# Patient Record
Sex: Female | Born: 1960 | Race: White | Hispanic: No | Marital: Married | Smoking: Never smoker
Health system: Southern US, Community
[De-identification: ages and names within clinical notes are randomized; demographics above are authoritative.]

## PROBLEM LIST (undated history)

## (undated) DIAGNOSIS — I10 Essential (primary) hypertension: Secondary | ICD-10-CM

## (undated) DIAGNOSIS — N200 Calculus of kidney: Secondary | ICD-10-CM

## (undated) DIAGNOSIS — Z85828 Personal history of other malignant neoplasm of skin: Secondary | ICD-10-CM

## (undated) DIAGNOSIS — K219 Gastro-esophageal reflux disease without esophagitis: Secondary | ICD-10-CM

## (undated) DIAGNOSIS — T7840XA Allergy, unspecified, initial encounter: Secondary | ICD-10-CM

## (undated) DIAGNOSIS — R42 Dizziness and giddiness: Secondary | ICD-10-CM

## (undated) HISTORY — PX: BREAST CYST EXCISION: SHX579

## (undated) HISTORY — DX: Personal history of other malignant neoplasm of skin: Z85.828

## (undated) HISTORY — DX: Allergy, unspecified, initial encounter: T78.40XA

## (undated) HISTORY — DX: Essential (primary) hypertension: I10

## (undated) HISTORY — DX: Dizziness and giddiness: R42

## (undated) HISTORY — PX: CERVICAL DISC SURGERY: SHX588

## (undated) HISTORY — DX: Gastro-esophageal reflux disease without esophagitis: K21.9

## (undated) HISTORY — DX: Calculus of kidney: N20.0

---

## 1996-06-06 HISTORY — PX: DILATION AND CURETTAGE OF UTERUS: SHX78

## 1998-11-17 ENCOUNTER — Other Ambulatory Visit: Admission: RE | Admit: 1998-11-17 | Discharge: 1998-11-17 | Payer: Self-pay | Admitting: Obstetrics and Gynecology

## 1999-06-14 ENCOUNTER — Other Ambulatory Visit: Admission: RE | Admit: 1999-06-14 | Discharge: 1999-06-14 | Payer: Self-pay | Admitting: *Deleted

## 1999-11-15 ENCOUNTER — Other Ambulatory Visit: Admission: RE | Admit: 1999-11-15 | Discharge: 1999-11-15 | Payer: Self-pay | Admitting: *Deleted

## 1999-12-07 ENCOUNTER — Ambulatory Visit (HOSPITAL_COMMUNITY): Admission: RE | Admit: 1999-12-07 | Discharge: 1999-12-07 | Payer: Self-pay | Admitting: Obstetrics and Gynecology

## 1999-12-07 ENCOUNTER — Encounter: Payer: Self-pay | Admitting: Obstetrics and Gynecology

## 2000-06-22 ENCOUNTER — Other Ambulatory Visit: Admission: RE | Admit: 2000-06-22 | Discharge: 2000-06-22 | Payer: Self-pay | Admitting: *Deleted

## 2001-01-11 ENCOUNTER — Ambulatory Visit (HOSPITAL_COMMUNITY): Admission: RE | Admit: 2001-01-11 | Discharge: 2001-01-11 | Payer: Self-pay | Admitting: Obstetrics and Gynecology

## 2001-01-11 ENCOUNTER — Encounter: Payer: Self-pay | Admitting: Obstetrics and Gynecology

## 2001-08-09 ENCOUNTER — Other Ambulatory Visit: Admission: RE | Admit: 2001-08-09 | Discharge: 2001-08-09 | Payer: Self-pay | Admitting: *Deleted

## 2002-03-05 ENCOUNTER — Ambulatory Visit (HOSPITAL_COMMUNITY): Admission: RE | Admit: 2002-03-05 | Discharge: 2002-03-05 | Payer: Self-pay | Admitting: *Deleted

## 2003-07-17 ENCOUNTER — Ambulatory Visit (HOSPITAL_COMMUNITY): Admission: RE | Admit: 2003-07-17 | Discharge: 2003-07-17 | Payer: Self-pay | Admitting: *Deleted

## 2003-07-22 ENCOUNTER — Encounter: Admission: RE | Admit: 2003-07-22 | Discharge: 2003-07-22 | Payer: Self-pay | Admitting: Emergency Medicine

## 2003-09-01 ENCOUNTER — Other Ambulatory Visit: Admission: RE | Admit: 2003-09-01 | Discharge: 2003-09-01 | Payer: Self-pay | Admitting: *Deleted

## 2004-08-04 ENCOUNTER — Encounter: Admission: RE | Admit: 2004-08-04 | Discharge: 2004-08-04 | Payer: Self-pay | Admitting: *Deleted

## 2005-09-20 ENCOUNTER — Encounter: Admission: RE | Admit: 2005-09-20 | Discharge: 2005-09-20 | Payer: Self-pay | Admitting: *Deleted

## 2005-09-26 ENCOUNTER — Ambulatory Visit: Payer: Self-pay | Admitting: Internal Medicine

## 2005-10-03 ENCOUNTER — Ambulatory Visit: Payer: Self-pay | Admitting: Internal Medicine

## 2006-05-08 ENCOUNTER — Ambulatory Visit: Payer: Self-pay | Admitting: Internal Medicine

## 2006-05-10 ENCOUNTER — Encounter: Admission: RE | Admit: 2006-05-10 | Discharge: 2006-05-10 | Payer: Self-pay | Admitting: Internal Medicine

## 2006-05-17 ENCOUNTER — Encounter: Admission: RE | Admit: 2006-05-17 | Discharge: 2006-06-01 | Payer: Self-pay | Admitting: Internal Medicine

## 2006-05-17 ENCOUNTER — Encounter: Payer: Self-pay | Admitting: Internal Medicine

## 2006-10-18 ENCOUNTER — Encounter: Admission: RE | Admit: 2006-10-18 | Discharge: 2006-10-18 | Payer: Self-pay | Admitting: Obstetrics and Gynecology

## 2007-01-02 ENCOUNTER — Ambulatory Visit: Payer: Self-pay | Admitting: Internal Medicine

## 2007-01-05 DIAGNOSIS — Z85820 Personal history of malignant melanoma of skin: Secondary | ICD-10-CM

## 2007-01-05 DIAGNOSIS — Z85828 Personal history of other malignant neoplasm of skin: Secondary | ICD-10-CM

## 2007-01-05 HISTORY — DX: Personal history of other malignant neoplasm of skin: Z85.828

## 2007-04-18 ENCOUNTER — Ambulatory Visit: Payer: Self-pay | Admitting: Internal Medicine

## 2007-11-09 ENCOUNTER — Encounter: Admission: RE | Admit: 2007-11-09 | Discharge: 2007-11-09 | Payer: Self-pay | Admitting: Obstetrics and Gynecology

## 2007-12-13 ENCOUNTER — Telehealth: Payer: Self-pay | Admitting: Internal Medicine

## 2007-12-14 ENCOUNTER — Ambulatory Visit: Payer: Self-pay | Admitting: Internal Medicine

## 2008-08-18 ENCOUNTER — Ambulatory Visit: Payer: Self-pay | Admitting: Internal Medicine

## 2008-08-18 LAB — CONVERTED CEMR LAB
ALT: 35 units/L (ref 0–35)
AST: 28 units/L (ref 0–37)
Basophils Absolute: 0 10*3/uL (ref 0.0–0.1)
Basophils Relative: 0.5 % (ref 0.0–3.0)
CO2: 30 meq/L (ref 19–32)
Calcium: 9 mg/dL (ref 8.4–10.5)
GFR calc Af Amer: 169 mL/min
MCV: 97.4 fL (ref 78.0–100.0)
Monocytes Absolute: 0.5 10*3/uL (ref 0.1–1.0)
Neutrophils Relative %: 64.8 % (ref 43.0–77.0)
Nitrite: NEGATIVE
Potassium: 3.5 meq/L (ref 3.5–5.1)
Protein, U semiquant: NEGATIVE
TSH: 0.75 microintl units/mL (ref 0.35–5.50)
Total Bilirubin: 0.8 mg/dL (ref 0.3–1.2)
Total CHOL/HDL Ratio: 3.4
Total Protein: 7 g/dL (ref 6.0–8.3)
Triglycerides: 120 mg/dL (ref 0–149)
Urobilinogen, UA: 0.2
WBC: 6.8 10*3/uL (ref 4.5–10.5)

## 2008-08-27 ENCOUNTER — Ambulatory Visit: Payer: Self-pay | Admitting: Internal Medicine

## 2008-11-17 ENCOUNTER — Encounter: Admission: RE | Admit: 2008-11-17 | Discharge: 2008-11-17 | Payer: Self-pay | Admitting: Internal Medicine

## 2009-02-06 ENCOUNTER — Ambulatory Visit: Payer: Self-pay | Admitting: Internal Medicine

## 2009-08-03 ENCOUNTER — Ambulatory Visit: Payer: Self-pay | Admitting: Internal Medicine

## 2009-10-16 ENCOUNTER — Ambulatory Visit: Payer: Self-pay | Admitting: Family Medicine

## 2009-10-16 LAB — CONVERTED CEMR LAB
Nitrite: NEGATIVE
Urobilinogen, UA: 0.2

## 2009-12-17 ENCOUNTER — Encounter: Admission: RE | Admit: 2009-12-17 | Discharge: 2009-12-17 | Payer: Self-pay | Admitting: Internal Medicine

## 2009-12-17 LAB — HM MAMMOGRAPHY: HM Mammogram: NEGATIVE

## 2010-03-24 ENCOUNTER — Ambulatory Visit: Payer: Self-pay | Admitting: Internal Medicine

## 2010-03-24 LAB — CONVERTED CEMR LAB
ALT: 18 units/L (ref 0–35)
AST: 17 units/L (ref 0–37)
Alkaline Phosphatase: 70 units/L (ref 39–117)
Basophils Absolute: 0 10*3/uL (ref 0.0–0.1)
Basophils Relative: 0.4 % (ref 0.0–3.0)
Bilirubin, Direct: 0.1 mg/dL (ref 0.0–0.3)
CO2: 30 meq/L (ref 19–32)
Calcium: 9.3 mg/dL (ref 8.4–10.5)
Chloride: 104 meq/L (ref 96–112)
Direct LDL: 118.5 mg/dL
GFR calc non Af Amer: 112.09 mL/min (ref >60)
Lymphocytes Relative: 28.1 % (ref 12.0–46.0)
Neutro Abs: 3.8 10*3/uL (ref 1.4–7.7)
Neutrophils Relative %: 60.3 % (ref 43.0–77.0)
Nitrite: POSITIVE
Platelets: 205 10*3/uL (ref 150.0–400.0)
Specific Gravity, Urine: 1.02
Total Protein: 6.5 g/dL (ref 6.0–8.3)
Triglycerides: 89 mg/dL (ref 0.0–149.0)
Urobilinogen, UA: 0.2

## 2010-04-01 ENCOUNTER — Encounter: Payer: Self-pay | Admitting: Internal Medicine

## 2010-04-01 ENCOUNTER — Ambulatory Visit: Payer: Self-pay | Admitting: Internal Medicine

## 2010-04-01 DIAGNOSIS — R42 Dizziness and giddiness: Secondary | ICD-10-CM

## 2010-04-01 HISTORY — DX: Dizziness and giddiness: R42

## 2010-04-08 ENCOUNTER — Encounter: Admission: RE | Admit: 2010-04-08 | Discharge: 2010-04-27 | Payer: Self-pay | Admitting: Internal Medicine

## 2010-04-20 ENCOUNTER — Encounter: Payer: Self-pay | Admitting: Internal Medicine

## 2010-04-22 ENCOUNTER — Encounter: Payer: Self-pay | Admitting: Internal Medicine

## 2010-05-03 ENCOUNTER — Telehealth: Payer: Self-pay | Admitting: Internal Medicine

## 2010-05-07 ENCOUNTER — Encounter
Admission: RE | Admit: 2010-05-07 | Discharge: 2010-06-03 | Payer: Self-pay | Source: Home / Self Care | Attending: Internal Medicine | Admitting: Internal Medicine

## 2010-05-24 ENCOUNTER — Encounter: Payer: Self-pay | Admitting: Internal Medicine

## 2010-06-08 ENCOUNTER — Encounter (INDEPENDENT_AMBULATORY_CARE_PROVIDER_SITE_OTHER): Payer: Self-pay | Admitting: *Deleted

## 2010-07-06 NOTE — Miscellaneous (Signed)
Summary: Discharge Summary for PT [facility]  Discharge Summary for PT Services/[facility]   Imported By: Maryln Gottron 05/11/2010 12:16:30  _____________________________________________________________________  External Attachment:    Type:   Image     Comment:   External Document

## 2010-07-06 NOTE — Assessment & Plan Note (Signed)
Summary: CPX   Vital Signs:  Patient profile:   50 year old female Height:      65 inches Weight:      132 pounds BMI:     22.05 Temp:     98.5 degrees F oral Pulse rate:   65 / minute Pulse rhythm:   regular Resp:     12 per minute BP sitting:   142 / 78  Vitals Entered By: Lynann Beaver CMA AAMA (April 01, 2010 8:18 AM) CC: cpx Is Patient Diabetic? No Pain Assessment Patient in pain? no        CC:  cpx.  History of Present Illness: CPX  Intermittent vertigo---ongoing for years.   Current Medications (verified): 1)  Vitamin D3 1000 Unit Tabs (Cholecalciferol) .... Twice Daily 2)  Zantac 150 Mg  Tabs (Ranitidine Hcl) .... Prn 3)  Zyrtec Allergy 10 Mg  Tabs (Cetirizine Hcl) .... Prn  Allergies (verified): No Known Drug Allergies  Past History:  Past Medical History: Reviewed history from 08/03/2009 and no changes required. Skin cancer, hx of,  BCC L shoulder, melanoma L leg neck orthopedic trouble Allergic rhinitis  Past Surgical History: local excision leg melanoma  cervical disc surgeries for neck orthopedic problem  Family History: father-- DM  mother--living , breast CA Family History Diabetes 1st degree relative--parents   Impression & Recommendations:  Problem # 1:  PREVENTIVE HEALTH CARE (ICD-V70.0)  Orders: Gastroenterology Referral (GI)  Problem # 2:  MELANOMA, LEG--LEFT (ICD-172.7)  Complete Medication List: 1)  Vitamin D3 1000 Unit Tabs (Cholecalciferol) .... Twice daily 2)  Zantac 150 Mg Tabs (Ranitidine hcl) .... Prn 3)  Zyrtec Allergy 10 Mg Tabs (Cetirizine hcl) .... Prn  Other Orders: Physical Therapy Referral (PT)   Orders Added: 1)  Physical Therapy Referral [PT] 2)  Est. Patient 40-64 years [99396] 3)  Gastroenterology Referral [GI]   Physical Exam General Appearance: well developed, well nourished, no acute distress Eyes: conjunctiva and lids normal, PERRL, EOMI,  Ears, Nose, Mouth, Throat: TM clear, nares clear,  oral exam WNL Neck: supple, no lymphadenopathy, no thyromegaly, no JVD Respiratory: clear to auscultation and percussion, respiratory effort normal Cardiovascular: regular rate and rhythm, S1-S2, no murmur, rub or gallop, no bruits, peripheral pulses normal and symmetric, no cyanosis, clubbing, edema or varicosities Chest: no scars, masses, tenderness; no asymmetry, skin changes, nipple discharge   Gastrointestinal: soft, non-tender; no hepatosplenomegaly, masses; active bowel sounds all quadrants, Lymphatic: no cervical, axillary or inguinal adenopathy Musculoskeletal: gait normal, muscle tone and strength WNL, no joint swelling, effusions, discoloration, crepitus  Skin: clear, good turgor, color WNL, no rashes, lesions, or ulcerations Neurologic: normal mental status, normal reflexes, normal strength, sensation, and motion Psychiatric: alert; oriented to person, place and time Other Exam:

## 2010-07-06 NOTE — Miscellaneous (Signed)
Summary: Initial Summary for PT Services/ Rehab  Initial Summary for PT Services/ Rehab   Imported By: Maryln Gottron 04/23/2010 13:17:33  _____________________________________________________________________  External Attachment:    Type:   Image     Comment:   External Document

## 2010-07-06 NOTE — Assessment & Plan Note (Signed)
Summary: BLADDER INFECTION? // RS   Vital Signs:  Patient profile:   50 year old female Weight:      136 pounds Temp:     97.7 degrees F oral BP sitting:   112 / 72  (right arm) Cuff size:   regular  Vitals Entered By: Duard Brady LPN (Oct 16, 2009 3:41 PM) CC: c/o possible bladder infection Is Patient Diabetic? No   History of Present Illness: Here for 2 days of burning on urination and urge to urinate. No fever or nausea. Her last UTI was around 10 yrs ago. She drinks plenty of water.   Allergies (verified): No Known Drug Allergies  Past History:  Past Medical History: Reviewed history from 08/03/2009 and no changes required. Skin cancer, hx of,  BCC L shoulder, melanoma L leg neck orthopedic trouble Allergic rhinitis  Past Surgical History: Reviewed history from 01/05/2007 and no changes required. local excision leg melanoma disc surgeries for neck orthopedic problem  Review of Systems  The patient denies anorexia, fever, weight loss, weight gain, vision loss, decreased hearing, hoarseness, chest pain, syncope, dyspnea on exertion, peripheral edema, prolonged cough, headaches, hemoptysis, abdominal pain, melena, hematochezia, severe indigestion/heartburn, hematuria, incontinence, genital sores, muscle weakness, suspicious skin lesions, transient blindness, difficulty walking, depression, unusual weight change, abnormal bleeding, enlarged lymph nodes, angioedema, breast masses, and testicular masses.    Physical Exam  General:  Well-developed,well-nourished,in no acute distress; alert,appropriate and cooperative throughout examination Abdomen:  Bowel sounds positive,abdomen soft and non-tender without masses, organomegaly or hernias noted.   Impression & Recommendations:  Problem # 1:  UTI (ICD-599.0)  Her updated medication list for this problem includes:    Cipro 500 Mg Tabs (Ciprofloxacin hcl) .Marland Kitchen..Marland Kitchen Two times a day  Complete Medication List: 1)   Vitamin D3 1000 Unit Tabs (Cholecalciferol) .... Twice daily 2)  Zantac 150 Mg Tabs (Ranitidine hcl) .... Prn 3)  Zyrtec Allergy 10 Mg Tabs (Cetirizine hcl) .... Prn 4)  Cipro 500 Mg Tabs (Ciprofloxacin hcl) .... Two times a day  Other Orders: UA Dipstick w/o Micro (manual) (36644)  Patient Instructions: 1)  Please schedule a follow-up appointment as needed .  Prescriptions: CIPRO 500 MG TABS (CIPROFLOXACIN HCL) two times a day  #14 x 0   Entered and Authorized by:   Nelwyn Salisbury MD   Signed by:   Nelwyn Salisbury MD on 10/16/2009   Method used:   Electronically to        [id number] (retail)       [address]       [place], [place]  [address]       Ph: [phone] or [phone]       Fax: [phone]   RxID:   [id number]   Laboratory Results   Urine Tests  Date/Time Received: Oct 16, 2009 3:55 PM  Date/Time Reported: Oct 16, 2009 3:55 PM   Routine Urinalysis   Color: yellow Appearance: Cloudy Glucose: negative   (Normal Range: Negative) Bilirubin: negative   (Normal Range: Negative) Ketone: negative   (Normal Range: Negative) Spec. Gravity: 1.010   (Normal Range: 1.003-1.035) Blood: moderate   (Normal Range: Negative) pH: 7.5   (Normal Range: 5.0-8.0) Protein: trace   (Normal Range: Negative) Urobilinogen: 0.2   (Normal Range: 0-1) Nitrite: negative   (Normal Range: Negative) Leukocyte Esterace: large   (Normal Range: Negative)

## 2010-07-06 NOTE — Progress Notes (Signed)
Summary: Needs Order  Phone Note From Other Clinic Call back at [phone]   Caller: Miley from [facility] Summary of Call: Need referral order for patient to be seen on Friday for vertigo.  Patient was previously seen at our office but was discharged.  Pt is now having problems with vertigo again and needs to be seen. Needs a new referral order faxed to [phone] Initial call taken by: Trixie Dredge,  May 03, 2010 1:37 PM  Follow-up for Phone Call        order faxed Follow-up by: Alfred Levins, CMA,  May 03, 2010 4:41 PM     Appended Document: Orders Update    Clinical Lists Changes  Orders: Added new Referral order of Physical Therapy Referral (PT) - Signed

## 2010-07-06 NOTE — Assessment & Plan Note (Signed)
Summary: ha/st/njr   Vital Signs:  Patient profile:   50 year old female Weight:      141 pounds Temp:     98.3 degrees F oral BP sitting:   108 / 70  (right arm) Cuff size:   regular  Vitals Entered By: Duard Brady LPN (August 03, 2009 2:32 PM) CC: c/o sore throat - headache Is Patient Diabetic? No   CC:  c/o sore throat - headache.  History of Present Illness: 50 year old patient who presents with a several day history of headache and sore throat.  She describes some sinus pain postnasal drip and fullness.  She states there has been exposure to strep.  at work.  She  denies any fever or cervical adenopathy.  No prior history of strep, but she does have allergic seasonal rhinitis.    Preventive Screening-Counseling & Management  Alcohol-Tobacco     Smoking Status: never  Allergies (verified): No Known Drug Allergies  Past History:  Past Medical History: Skin cancer, hx of,  BCC L shoulder, melanoma L leg neck orthopedic trouble Allergic rhinitis  Review of Systems       The patient complains of hoarseness and headaches.  The patient denies anorexia, fever, weight loss, weight gain, vision loss, decreased hearing, chest pain, syncope, dyspnea on exertion, peripheral edema, prolonged cough, hemoptysis, abdominal pain, melena, hematochezia, severe indigestion/heartburn, hematuria, incontinence, genital sores, muscle weakness, suspicious skin lesions, transient blindness, difficulty walking, depression, unusual weight change, abnormal bleeding, enlarged lymph nodes, angioedema, and breast masses.    Physical Exam  General:  Well-developed,well-nourished,in no acute distress; alert,appropriate and cooperative throughout examination Head:  Normocephalic and atraumatic without obvious abnormalities. No apparent alopecia or balding. Eyes:  No corneal or conjunctival inflammation noted. EOMI. Perrla. Funduscopic exam benign, without hemorrhages, exudates or papilledema.  Vision grossly normal. Ears:  External ear exam shows no significant lesions or deformities.  Otoscopic examination reveals clear canals, tympanic membranes are intact bilaterally without bulging, retraction, inflammation or discharge. Hearing is grossly normal bilaterally. Mouth:  pharyngeal erythema.  pharyngeal erythema.   Neck:  No deformities, masses, or tenderness noted. Lungs:  Normal respiratory effort, chest expands symmetrically. Lungs are clear to auscultation, no crackles or wheezes. Heart:  Normal rate and regular rhythm. S1 and S2 normal without gallop, murmur, click, rub or other extra sounds. Abdomen:  Bowel sounds positive,abdomen soft and non-tender without masses, organomegaly or hernias noted.   Impression & Recommendations:  Problem # 1:  URI (ICD-465.9)  Her updated medication list for this problem includes:    Zyrtec Allergy 10 Mg Tabs (Cetirizine hcl) .Marland Kitchen... Prn  Problem # 2:  ALLERGIC RHINITIS (ICD-477.9)  Her updated medication list for this problem includes:    Zyrtec Allergy 10 Mg Tabs (Cetirizine hcl) .Marland Kitchen... Prn  Complete Medication List: 1)  Vitamin D3 1000 Unit Tabs (Cholecalciferol) .... Twice daily 2)  Zantac 150 Mg Tabs (Ranitidine hcl) .... Prn 3)  Zyrtec Allergy 10 Mg Tabs (Cetirizine hcl) .... Prn  Other Orders: Rapid Strep (11914)  Patient Instructions: 1)  Get plenty of rest, drink lots of clear liquids, and use Tylenol or Ibuprofen for fever and comfort. Return in 7-10 days if you're not better:sooner if you're feeling worse.

## 2010-07-08 NOTE — Letter (Signed)
Summary: Referral - not able to see patient  [facility]  [address] [place], [place] [address]   Phone: [phone]  Fax: [phone]    June 08, 2010   Birdie Sons, MD [address] [place], [place] [address]   Re:   JENNEY BRESTER DOB:  [birth year] MRN:   [id number]    Dear Dr. Cato Mulligan:  Thank you for your kind referral of the above patient.  We have attempted to schedule the recommended procedure Screening Colonoscopy but have not been able to schedule because:  X   The patient was not available by phone and/or has not returned our calls.  ___ The patient declined to schedule the procedure at this time.  We appreciate the referral and hope that we will have the opportunity to treat this patient in the future.    Sincerely,    [facility] [phone]

## 2010-07-08 NOTE — Miscellaneous (Signed)
Summary: Initial Summary for PT Services/[facility]  Initial Summary for PT Services/[facility]   Imported By: Maryln Gottron 06/02/2010 10:55:52  _____________________________________________________________________  External Attachment:    Type:   Image     Comment:   External Document

## 2010-11-18 ENCOUNTER — Encounter: Payer: Self-pay | Admitting: Internal Medicine

## 2010-11-19 ENCOUNTER — Other Ambulatory Visit: Payer: Self-pay

## 2010-11-19 ENCOUNTER — Ambulatory Visit (INDEPENDENT_AMBULATORY_CARE_PROVIDER_SITE_OTHER): Payer: BC Managed Care – PPO | Admitting: Internal Medicine

## 2010-11-19 ENCOUNTER — Encounter: Payer: Self-pay | Admitting: Internal Medicine

## 2010-11-19 VITALS — BP 128/82 | Temp 98.0°F | Wt 134.0 lb

## 2010-11-19 DIAGNOSIS — K1370 Unspecified lesions of oral mucosa: Secondary | ICD-10-CM

## 2010-11-19 DIAGNOSIS — K1379 Other lesions of oral mucosa: Secondary | ICD-10-CM

## 2010-11-19 MED ORDER — FIRST-DUKES MOUTHWASH MT SUSP: OROMUCOSAL | Status: DC

## 2010-11-19 MED ORDER — PENICILLIN V POTASSIUM 500 MG PO TABS: 500.0000 mg | ORAL_TABLET | Freq: Four times a day (QID) | ORAL | Status: DC

## 2010-11-19 MED ORDER — PENICILLIN V POTASSIUM 500 MG PO TABS: 500.0000 mg | ORAL_TABLET | Freq: Four times a day (QID) | ORAL | Status: AC

## 2010-11-19 NOTE — Patient Instructions (Signed)
His mouthwash 4 times daily  Take antibiotic if symptoms are not to medically improved in 48 hours or if symptoms worsen  Call or return to clinic prn if these symptoms worsen or fail to improve as anticipated.
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
## 2010-12-14 ENCOUNTER — Other Ambulatory Visit: Payer: Self-pay | Admitting: Internal Medicine

## 2010-12-14 DIAGNOSIS — Z1231 Encounter for screening mammogram for malignant neoplasm of breast: Secondary | ICD-10-CM

## 2010-12-21 ENCOUNTER — Ambulatory Visit
Admission: RE | Admit: 2010-12-21 | Discharge: 2010-12-21 | Disposition: A | Discharge status: Home / Self Care | Payer: BC Managed Care – PPO | Source: Ambulatory Visit | Attending: Internal Medicine | Admitting: Internal Medicine

## 2010-12-21 ENCOUNTER — Ambulatory Visit (INDEPENDENT_AMBULATORY_CARE_PROVIDER_SITE_OTHER): Payer: BC Managed Care – PPO | Admitting: Internal Medicine

## 2010-12-21 DIAGNOSIS — Z1231 Encounter for screening mammogram for malignant neoplasm of breast: Secondary | ICD-10-CM

## 2010-12-21 DIAGNOSIS — Z23 Encounter for immunization: Secondary | ICD-10-CM

## 2010-12-22 NOTE — Progress Notes (Signed)
  Subjective:    Patient ID: Brittney Burke, female    DOB: Feb 20, 1961, 50 y.o.   MRN: [id number]  HPI immunization only    Review of Systems     Objective:   Physical Exam        Assessment & Plan:

## 2011-05-09 ENCOUNTER — Other Ambulatory Visit: Payer: Self-pay | Admitting: Obstetrics and Gynecology

## 2011-05-09 DIAGNOSIS — M8580 Other specified disorders of bone density and structure, unspecified site: Secondary | ICD-10-CM

## 2011-05-09 DIAGNOSIS — N644 Mastodynia: Secondary | ICD-10-CM

## 2011-05-09 DIAGNOSIS — N6452 Nipple discharge: Secondary | ICD-10-CM

## 2011-05-23 ENCOUNTER — Ambulatory Visit
Admission: RE | Admit: 2011-05-23 | Discharge: 2011-05-23 | Disposition: A | Discharge status: Home / Self Care | Payer: BC Managed Care – PPO | Source: Ambulatory Visit | Attending: Obstetrics and Gynecology | Admitting: Obstetrics and Gynecology

## 2011-05-23 DIAGNOSIS — N6452 Nipple discharge: Secondary | ICD-10-CM

## 2011-05-23 DIAGNOSIS — N644 Mastodynia: Secondary | ICD-10-CM

## 2011-05-23 DIAGNOSIS — M8580 Other specified disorders of bone density and structure, unspecified site: Secondary | ICD-10-CM

## 2011-11-21 ENCOUNTER — Other Ambulatory Visit: Payer: Self-pay | Admitting: Internal Medicine

## 2011-11-21 DIAGNOSIS — Z1231 Encounter for screening mammogram for malignant neoplasm of breast: Secondary | ICD-10-CM

## 2011-12-23 ENCOUNTER — Ambulatory Visit: Payer: BC Managed Care – PPO

## 2011-12-27 ENCOUNTER — Ambulatory Visit
Admission: RE | Admit: 2011-12-27 | Discharge: 2011-12-27 | Disposition: A | Discharge status: Home / Self Care | Payer: BC Managed Care – PPO | Source: Ambulatory Visit | Attending: Internal Medicine | Admitting: Internal Medicine

## 2011-12-27 DIAGNOSIS — Z1231 Encounter for screening mammogram for malignant neoplasm of breast: Secondary | ICD-10-CM

## 2011-12-30 ENCOUNTER — Encounter: Payer: Self-pay | Admitting: Family Medicine

## 2011-12-30 ENCOUNTER — Ambulatory Visit (INDEPENDENT_AMBULATORY_CARE_PROVIDER_SITE_OTHER): Payer: BC Managed Care – PPO | Admitting: Family Medicine

## 2011-12-30 VITALS — BP 124/80 | HR 99 | Temp 98.3°F | Wt 133.0 lb

## 2011-12-30 DIAGNOSIS — J329 Chronic sinusitis, unspecified: Secondary | ICD-10-CM

## 2011-12-30 MED ORDER — AZITHROMYCIN 250 MG PO TABS: ORAL_TABLET | ORAL | Status: AC

## 2011-12-30 NOTE — Progress Notes (Signed)
  Subjective:    Patient ID: Brittney Burke, female    DOB: 1960/12/28, 52 y.o.   MRN: [id number]  HPI Here for one week of sinus pressure, ear pressure, PND, ST, and HAs. No fever or cough. Using Sudafed.    Review of Systems  Constitutional: Negative.   HENT: Positive for congestion, postnasal drip and sinus pressure.   Eyes: Negative.   Respiratory: Negative.        Objective:   Physical Exam  Constitutional: She appears well-developed and well-nourished.  HENT:  Right Ear: External ear normal.  Nose: Nose normal.  Mouth/Throat: Oropharynx is clear and moist.  Eyes: Conjunctivae are normal.  Neck: No thyromegaly present.  Pulmonary/Chest: Effort normal and breath sounds normal.  Lymphadenopathy:    She has no cervical adenopathy.          Assessment & Plan:  Add Mucinex.

## 2013-02-12 ENCOUNTER — Other Ambulatory Visit: Payer: Self-pay

## 2013-02-12 DIAGNOSIS — Z1231 Encounter for screening mammogram for malignant neoplasm of breast: Secondary | ICD-10-CM

## 2013-02-26 ENCOUNTER — Ambulatory Visit (INDEPENDENT_AMBULATORY_CARE_PROVIDER_SITE_OTHER): Payer: Managed Care, Other (non HMO) | Admitting: Family Medicine

## 2013-02-26 ENCOUNTER — Encounter: Payer: Self-pay | Admitting: Family Medicine

## 2013-02-26 VITALS — BP 132/80 | HR 84 | Temp 98.2°F | Wt 133.0 lb

## 2013-02-26 DIAGNOSIS — IMO0002 Reserved for concepts with insufficient information to code with codable children: Secondary | ICD-10-CM

## 2013-02-26 DIAGNOSIS — M5416 Radiculopathy, lumbar region: Secondary | ICD-10-CM

## 2013-02-26 NOTE — Progress Notes (Signed)
  Subjective:    Patient ID: Brittney Burke, female    DOB: 1960-12-30, 52 y.o.   MRN: [id number]  HPI Patient seen with recurrent back pain left lower lumbar region. She had similar occurrence about 7 years ago. MRI scan at that time revealed disc herniation at L5-S1 which eventually improved with physical therapy. Her current pain started about one month ago. Location is left lower lumbar and radiates into the buttock and about midway down left thigh. Achy quality. 3/10 severity to 7/10 at its worst. Using Aleve with minimal improvement. She has some transient left anterior shin numbness. No weakness. No loss of bladder or bowel control. No appetite or weight changes. She's doing home stretches and yoga without much improvement. Previously improved with physical therapy. She would like to try the same.  Denies any recent injury. No fevers or chills.  Past Medical History  Diagnosis Date  . SKIN CANCER, HX OF 01/05/2007  . VERTIGO 04/01/2010  . Allergy    Past Surgical History  Procedure Laterality Date  . Cervical disc surgery      reports that she has never smoked. She has never used smokeless tobacco. She reports that she drinks about 3.5 ounces of alcohol per week. She reports that she does not use illicit drugs. family history includes Cancer in her mother; Diabetes in her father. No Known Allergies    Review of Systems  Constitutional: Negative for fever, chills, appetite change and unexpected weight change.  Genitourinary: Negative for dysuria.  Musculoskeletal: Positive for back pain.  Neurological: Positive for numbness. Negative for weakness.       Objective:   Physical Exam  Constitutional: She appears well-developed and well-nourished.  Cardiovascular: Normal rate and regular rhythm.   Musculoskeletal:  Minimally tender left lower lumbar region. Straight leg raises are negative.  Neurological:  Deep tender reflexes are symmetric knee. Slightly diminished left ankle  compared to right. She has full-strength with plantar flexion, dorsiflexion, and knee extension bilaterally.          Assessment & Plan:  Left lumbar radiculopathy. No focal weakness. Set up physical therapy. May need repeat imaging if not improving over the next few weeks

## 2013-02-26 NOTE — Patient Instructions (Addendum)

## 2013-03-04 ENCOUNTER — Ambulatory Visit: Payer: Managed Care, Other (non HMO) | Attending: Family Medicine

## 2013-03-04 DIAGNOSIS — M545 Low back pain, unspecified: Secondary | ICD-10-CM | POA: Insufficient documentation

## 2013-03-04 DIAGNOSIS — M25569 Pain in unspecified knee: Secondary | ICD-10-CM | POA: Insufficient documentation

## 2013-03-04 DIAGNOSIS — IMO0001 Reserved for inherently not codable concepts without codable children: Secondary | ICD-10-CM | POA: Insufficient documentation

## 2013-03-04 DIAGNOSIS — R5381 Other malaise: Secondary | ICD-10-CM | POA: Insufficient documentation

## 2013-03-11 ENCOUNTER — Ambulatory Visit
Admission: RE | Admit: 2013-03-11 | Discharge: 2013-03-11 | Disposition: A | Discharge status: Home / Self Care | Payer: Managed Care, Other (non HMO) | Source: Ambulatory Visit

## 2013-03-11 ENCOUNTER — Ambulatory Visit: Payer: Managed Care, Other (non HMO) | Attending: Family Medicine | Admitting: Physical Therapy

## 2013-03-11 DIAGNOSIS — Z1231 Encounter for screening mammogram for malignant neoplasm of breast: Secondary | ICD-10-CM

## 2013-03-11 DIAGNOSIS — IMO0001 Reserved for inherently not codable concepts without codable children: Secondary | ICD-10-CM | POA: Insufficient documentation

## 2013-03-11 DIAGNOSIS — R5381 Other malaise: Secondary | ICD-10-CM | POA: Insufficient documentation

## 2013-03-11 DIAGNOSIS — M25569 Pain in unspecified knee: Secondary | ICD-10-CM | POA: Insufficient documentation

## 2013-03-11 DIAGNOSIS — M545 Low back pain, unspecified: Secondary | ICD-10-CM | POA: Insufficient documentation

## 2013-03-14 ENCOUNTER — Ambulatory Visit: Payer: Managed Care, Other (non HMO) | Admitting: Physical Therapy

## 2013-03-18 ENCOUNTER — Ambulatory Visit: Payer: Managed Care, Other (non HMO)

## 2013-03-21 ENCOUNTER — Ambulatory Visit: Payer: Managed Care, Other (non HMO) | Admitting: Physical Therapy

## 2013-03-25 ENCOUNTER — Encounter: Payer: Managed Care, Other (non HMO) | Admitting: Physical Therapy

## 2013-03-28 ENCOUNTER — Encounter: Payer: Managed Care, Other (non HMO) | Admitting: Physical Therapy

## 2013-06-05 ENCOUNTER — Ambulatory Visit (INDEPENDENT_AMBULATORY_CARE_PROVIDER_SITE_OTHER): Payer: Managed Care, Other (non HMO) | Admitting: Family

## 2013-06-05 ENCOUNTER — Encounter: Payer: Self-pay | Admitting: Family

## 2013-06-05 VITALS — BP 118/68 | HR 82 | Wt 135.6 lb

## 2013-06-05 DIAGNOSIS — M778 Other enthesopathies, not elsewhere classified: Secondary | ICD-10-CM

## 2013-06-05 DIAGNOSIS — M25531 Pain in right wrist: Secondary | ICD-10-CM

## 2013-06-05 DIAGNOSIS — M25539 Pain in unspecified wrist: Secondary | ICD-10-CM

## 2013-06-05 DIAGNOSIS — M65839 Other synovitis and tenosynovitis, unspecified forearm: Secondary | ICD-10-CM

## 2013-06-05 MED ORDER — MELOXICAM 15 MG PO TABS: 15.0000 mg | ORAL_TABLET | Freq: Every day | ORAL | Status: DC

## 2013-06-05 NOTE — Patient Instructions (Signed)
Tendinitis °Tendinitis is swelling and inflammation of the tendons. Tendons are band-like tissues that connect muscle to bone. Tendinitis commonly occurs in the:  °· Shoulders (rotator cuff). °· Heels (Achilles tendon). °· Elbows (triceps tendon). °CAUSES °Tendinitis is usually caused by overusing the tendon, muscles, and joints involved. When the tissue surrounding a tendon (synovium) becomes inflamed, it is called tenosynovitis. Tendinitis commonly develops in people whose jobs require repetitive motions. °SYMPTOMS °· Pain. °· Tenderness. °· Mild swelling. °DIAGNOSIS °Tendinitis is usually diagnosed by physical exam. Your caregiver may also order X-rays or other imaging tests. °TREATMENT °Your caregiver may recommend certain medicines or exercises for your treatment. °HOME CARE INSTRUCTIONS  °· Use a sling or splint for as long as directed by your caregiver until the pain decreases. °· Put ice on the injured area. °· Put ice in a plastic bag. °· Place a towel between your skin and the bag. °· Leave the ice on for 15-20 minutes, 03-04 times a day. °· Avoid using the limb while the tendon is painful. Perform gentle range of motion exercises only as directed by your caregiver. Stop exercises if pain or discomfort increase, unless directed otherwise by your caregiver. °· Only take over-the-counter or prescription medicines for pain, discomfort, or fever as directed by your caregiver. °SEEK MEDICAL CARE IF:  °· Your pain and swelling increase. °· You develop new, unexplained symptoms, especially increased numbness in the hands. °MAKE SURE YOU:  °· Understand these instructions. °· Will watch your condition. °· Will get help right away if you are not doing well or get worse. °Document Released: 05/20/2000 Document Revised: 08/15/2011 Document Reviewed: 08/09/2010 °[employer]® Patient Information ©2014 [employer]. ° °

## 2013-06-05 NOTE — Progress Notes (Signed)
Pre visit review using our clinic review tool, if applicable. No additional management support is needed unless otherwise documented below in the visit note. 

## 2013-06-05 NOTE — Progress Notes (Signed)
Subjective:    Patient ID: Brittney Burke, female    DOB: [birth year], 52 y.o.   MRN: [id number]  HPI 52 year old white female, nonsmoker, patient of Dr. Cato Mulligan in today with complaints of right wrist pain ongoing x1 week. She has taken Aleve that has not helped her symptoms. Reports that she's been more active recently cooking for the holidays, yoga, and other activities. She describes the pain as a sharp twinge but typically worse in the morning. Rates the pain a 6/10. Denies any swelling or any previous injury.   Review of Systems  Constitutional: Negative.   HENT: Negative.   Respiratory: Negative.   Cardiovascular: Negative.   Genitourinary: Negative.   Musculoskeletal: Positive for arthralgias.       Right wrist pain  Skin: Negative.   Neurological: Negative.   Hematological: Negative.   Psychiatric/Behavioral: Negative.    Past Medical History  Diagnosis Date  . SKIN CANCER, HX OF 01/05/2007  . VERTIGO 04/01/2010  . Allergy     History   Social History  . Marital Status: Married    Spouse Name: N/A    Number of Children: N/A  . Years of Education: N/A   Occupational History  . Not on file.   Social History Main Topics  . Smoking status: Never Smoker   . Smokeless tobacco: Never Used  . Alcohol Use: 3.5 oz/week    7 drink(s) per week  . Drug Use: No  . Sexual Activity: Not on file   Other Topics Concern  . Not on file   Social History Narrative  . No narrative on file    Past Surgical History  Procedure Laterality Date  . Cervical disc surgery      Family History  Problem Relation Age of Onset  . Cancer Mother     breast  . Diabetes Father     No Known Allergies  Current Outpatient Prescriptions on File Prior to Visit  Medication Sig Dispense Refill  . aspirin 81 MG tablet Take 81 mg by mouth daily.      . cetirizine (ZYRTEC) 10 MG tablet Take 10 mg by mouth as needed.        . Cholecalciferol (VITAMIN D3) 1000 UNITS CAPS Take by mouth  daily.       . ranitidine (ZANTAC) 150 MG tablet Take 150 mg by mouth as needed.         No current facility-administered medications on file prior to visit.    BP 118/68  Pulse 82  Wt 135 lb 9.6 oz (61.508 kg)chart    Objective:   Physical Exam  Constitutional: She is oriented to person, place, and time. She appears well-developed and well-nourished.  HENT:  Right Ear: External ear normal.  Left Ear: External ear normal.  Nose: Nose normal.  Mouth/Throat: Oropharynx is clear and moist.  Neck: Normal range of motion. Neck supple.  Cardiovascular: Normal rate, regular rhythm and normal heart sounds.   Pulmonary/Chest: Effort normal and breath sounds normal.  Abdominal: Soft. Bowel sounds are normal.  Musculoskeletal: She exhibits tenderness. She exhibits no edema.  Right wrist pain with adduction and to palpation of the medial aspect of the wrist.   Neurological: She is alert and oriented to person, place, and time. She has normal reflexes. She displays normal reflexes. No cranial nerve deficit. Coordination normal.  Skin: Skin is warm and dry.  Psychiatric: She has a normal mood and affect.  Assessment & Plan:  Assessment: 1. Right wrist pain 2. Tendinitis  Plan: Mobic 15 mg once daily with food. Ice to the affected area. Rest. Cardiopathy symptoms worsen or persist. And recheck as scheduled and sooner as needed.

## 2013-06-06 LAB — HM PAP SMEAR

## 2013-08-19 ENCOUNTER — Other Ambulatory Visit (INDEPENDENT_AMBULATORY_CARE_PROVIDER_SITE_OTHER): Payer: Managed Care, Other (non HMO)

## 2013-08-19 DIAGNOSIS — Z0000 Encounter for general adult medical examination without abnormal findings: Secondary | ICD-10-CM

## 2013-08-19 LAB — BASIC METABOLIC PANEL
BUN: 14 mg/dL (ref 6–23)
CHLORIDE: 101 meq/L (ref 96–112)
CO2: 30 mEq/L (ref 19–32)
Calcium: 9.4 mg/dL (ref 8.4–10.5)
Creatinine, Ser: 0.6 mg/dL (ref 0.4–1.2)
GFR: 106.51 mL/min (ref >60.00)
Glucose, Bld: 121 mg/dL — ABNORMAL HIGH (ref 70–99)
POTASSIUM: 4.1 meq/L (ref 3.5–5.1)
SODIUM: 136 meq/L (ref 135–145)

## 2013-08-19 LAB — HEPATIC FUNCTION PANEL
ALT: 19 U/L (ref 0–35)
AST: 20 U/L (ref 0–37)
Albumin: 4.3 g/dL (ref 3.5–5.2)
Alkaline Phosphatase: 61 U/L (ref 39–117)
Bilirubin, Direct: 0.1 mg/dL (ref 0.0–0.3)
Total Bilirubin: 0.7 mg/dL (ref 0.3–1.2)
Total Protein: 7.1 g/dL (ref 6.0–8.3)

## 2013-08-19 LAB — LIPID PANEL
CHOL/HDL RATIO: 3
CHOLESTEROL: 210 mg/dL — AB (ref 0–200)
HDL: 80.5 mg/dL (ref >39.00)
LDL CALC: 105 mg/dL — AB (ref 0–99)
Triglycerides: 125 mg/dL (ref 0.0–149.0)
VLDL: 25 mg/dL (ref 0.0–40.0)

## 2013-08-19 LAB — CBC WITH DIFFERENTIAL/PLATELET
BASOS PCT: 0.4 % (ref 0.0–3.0)
Basophils Absolute: 0 10*3/uL (ref 0.0–0.1)
Eosinophils Absolute: 0.3 10*3/uL (ref 0.0–0.7)
Eosinophils Relative: 3.4 % (ref 0.0–5.0)
HCT: 43.5 % (ref 36.0–46.0)
HEMOGLOBIN: 14.6 g/dL (ref 12.0–15.0)
LYMPHS ABS: 1.4 10*3/uL (ref 0.7–4.0)
Lymphocytes Relative: 17.5 % (ref 12.0–46.0)
MCHC: 33.6 g/dL (ref 30.0–36.0)
MCV: 100.3 fl — AB (ref 78.0–100.0)
MONO ABS: 0.5 10*3/uL (ref 0.1–1.0)
Monocytes Relative: 6.4 % (ref 3.0–12.0)
NEUTROS ABS: 6 10*3/uL (ref 1.4–7.7)
Neutrophils Relative %: 72.3 % (ref 43.0–77.0)
Platelets: 215 10*3/uL (ref 150.0–400.0)
RBC: 4.34 Mil/uL (ref 3.87–5.11)
RDW: 12.5 % (ref 11.5–14.6)
WBC: 8.2 10*3/uL (ref 4.5–10.5)

## 2013-08-19 LAB — POCT URINALYSIS DIPSTICK
Bilirubin, UA: NEGATIVE
GLUCOSE UA: NEGATIVE
Ketones, UA: NEGATIVE
NITRITE UA: NEGATIVE
Protein, UA: NEGATIVE
Spec Grav, UA: 1.02
UROBILINOGEN UA: 0.2
pH, UA: 5.5

## 2013-08-19 LAB — TSH: TSH: 1.68 u[IU]/mL (ref 0.35–5.50)

## 2013-08-21 ENCOUNTER — Ambulatory Visit (INDEPENDENT_AMBULATORY_CARE_PROVIDER_SITE_OTHER): Payer: Managed Care, Other (non HMO) | Admitting: Internal Medicine

## 2013-08-21 ENCOUNTER — Encounter: Payer: Self-pay | Admitting: Internal Medicine

## 2013-08-21 VITALS — BP 136/86 | HR 72 | Temp 98.5°F | Ht 65.0 in | Wt 133.0 lb

## 2013-08-21 DIAGNOSIS — Z0000 Encounter for general adult medical examination without abnormal findings: Secondary | ICD-10-CM

## 2013-08-21 DIAGNOSIS — R7309 Other abnormal glucose: Secondary | ICD-10-CM

## 2013-08-21 DIAGNOSIS — R739 Hyperglycemia, unspecified: Secondary | ICD-10-CM

## 2013-08-21 DIAGNOSIS — R319 Hematuria, unspecified: Secondary | ICD-10-CM

## 2013-08-21 LAB — URINALYSIS, ROUTINE W REFLEX MICROSCOPIC
Bilirubin Urine: NEGATIVE
Ketones, ur: NEGATIVE
Nitrite: NEGATIVE
Specific Gravity, Urine: >=1.03 — AB (ref 1.000–1.030)
Total Protein, Urine: NEGATIVE
UROBILINOGEN UA: 0.2 (ref 0.0–1.0)
Urine Glucose: NEGATIVE
pH: 5.5 (ref 5.0–8.0)

## 2013-08-21 LAB — HEMOGLOBIN A1C: Hgb A1c MFr Bld: 5.4 % (ref 4.6–6.5)

## 2013-08-21 LAB — GLUCOSE, RANDOM: Glucose, Bld: 149 mg/dL — ABNORMAL HIGH (ref 70–99)

## 2013-08-21 NOTE — Progress Notes (Signed)
Pre visit review using our clinic review tool, if applicable. No additional management support is needed unless otherwise documented below in the visit note. 

## 2013-08-21 NOTE — Progress Notes (Signed)
cpx  Past Medical History  Diagnosis Date  . SKIN CANCER, HX OF 01/05/2007  . VERTIGO 04/01/2010  . Allergy     History   Social History  . Marital Status: Married    Spouse Name: N/A    Number of Children: N/A  . Years of Education: N/A   Occupational History  . Not on file.   Social History Main Topics  . Smoking status: Never Smoker   . Smokeless tobacco: Never Used  . Alcohol Use: 3.5 oz/week    7 drink(s) per week  . Drug Use: No  . Sexual Activity: Not on file   Other Topics Concern  . Not on file   Social History Narrative  . No narrative on file    Past Surgical History  Procedure Laterality Date  . Cervical disc surgery      Family History  Problem Relation Age of Onset  . Cancer Mother     breast  . Diabetes Father     No Known Allergies  Current Outpatient Prescriptions on File Prior to Visit  Medication Sig Dispense Refill  . aspirin 81 MG tablet Take 81 mg by mouth daily.      . cetirizine (ZYRTEC) 10 MG tablet Take 10 mg by mouth as needed.        . Cholecalciferol (VITAMIN D3) 1000 UNITS CAPS Take by mouth daily.       . ranitidine (ZANTAC) 150 MG tablet Take 150 mg by mouth as needed.         No current facility-administered medications on file prior to visit.     patient denies chest pain, shortness of breath, orthopnea. Denies lower extremity edema, abdominal pain, change in appetite, change in bowel movements. Patient denies rashes, musculoskeletal complaints. No other specific complaints in a complete review of systems.   BP 136/86  Pulse 72  Temp(Src) 98.5 F (36.9 C) (Oral)  Ht 5\' 5"  (1.651 m)  Wt 133 lb (60.328 kg)  BMI 22.13 kg/m2  Well-developed well-nourished female in no acute distress. HEENT exam atraumatic, normocephalic, extraocular muscles are intact. Neck is supple. No jugular venous distention no thyromegaly. Chest clear to auscultation without increased work of breathing. Cardiac exam S1 and S2 are regular. Abdominal  exam active bowel sounds, soft, nontender. Extremities no edema. Neurologic exam she is alert without any motor sensory deficits. Gait is normal.  Well Visit- health maint UTD

## 2013-08-22 ENCOUNTER — Encounter: Payer: Self-pay | Admitting: Gastroenterology

## 2013-08-23 ENCOUNTER — Other Ambulatory Visit: Payer: Managed Care, Other (non HMO)

## 2013-08-24 LAB — URINE CULTURE

## 2013-08-27 ENCOUNTER — Encounter: Payer: Managed Care, Other (non HMO) | Admitting: Internal Medicine

## 2013-09-27 ENCOUNTER — Ambulatory Visit (INDEPENDENT_AMBULATORY_CARE_PROVIDER_SITE_OTHER): Payer: Managed Care, Other (non HMO) | Admitting: Physician Assistant

## 2013-09-27 ENCOUNTER — Ambulatory Visit (INDEPENDENT_AMBULATORY_CARE_PROVIDER_SITE_OTHER)
Admission: RE | Admit: 2013-09-27 | Discharge: 2013-09-27 | Disposition: A | Discharge status: Home / Self Care | Payer: Managed Care, Other (non HMO) | Source: Ambulatory Visit | Attending: Physician Assistant | Admitting: Physician Assistant

## 2013-09-27 ENCOUNTER — Encounter: Payer: Self-pay | Admitting: Physician Assistant

## 2013-09-27 VITALS — BP 150/90 | HR 94 | Temp 98.2°F | Resp 20 | Wt 134.0 lb

## 2013-09-27 DIAGNOSIS — R109 Unspecified abdominal pain: Secondary | ICD-10-CM

## 2013-09-27 DIAGNOSIS — N200 Calculus of kidney: Secondary | ICD-10-CM

## 2013-09-27 MED ORDER — HYDROCODONE-ACETAMINOPHEN 5-325 MG PO TABS: 1.0000 | ORAL_TABLET | Freq: Every evening | ORAL | Status: DC | PRN

## 2013-09-27 MED ORDER — TAMSULOSIN HCL 0.4 MG PO CAPS: 0.4000 mg | ORAL_CAPSULE | Freq: Every day | ORAL | Status: DC

## 2013-09-27 MED ORDER — KETOROLAC TROMETHAMINE 10 MG PO TABS: 10.0000 mg | ORAL_TABLET | Freq: Four times a day (QID) | ORAL | Status: DC | PRN

## 2013-09-27 MED ORDER — KETOROLAC TROMETHAMINE 60 MG/2ML IM SOLN
30.0000 mg | Freq: Once | INTRAMUSCULAR | Status: AC
  Administered 2013-09-27: 30 mg via INTRAMUSCULAR

## 2013-09-27 NOTE — Progress Notes (Signed)
Pre visit review using our clinic review tool, if applicable. No additional management support is needed unless otherwise documented below in the visit note. 

## 2013-09-27 NOTE — Patient Instructions (Signed)
You will have a CT scan to check for obstruction today. This will be at [address] at 11am.  Hydrocodone 5-325 by mouth at bedtime for severe pain. No driving while taking this medication.   Tamsulosin 0.4 mg by mouth daily to help increase urination .  Toradol 10 mg tab by mouth every 6-8 hours to help with pain and increase dilation of ureter.   Stopped taking Advil. Don't take Motrin, ibuprofen, Aleve, naproxen while taking Toradol.   Followup is symptoms worsen or do not improve despite treatment.   Kidney Stones Kidney stones (urolithiasis) are solid masses that form inside your kidneys. The intense pain is caused by the stone moving through the kidney, ureter, bladder, and urethra (urinary tract). When the stone moves, the ureter starts to spasm around the stone. The stone is usually passed in your pee (urine).  HOME CARE  Drink enough fluids to keep your pee clear or pale yellow. This helps to get the stone out.  Strain all pee through the provided strainer. Do not pee without peeing through the strainer, not even once. If you pee the stone out, catch it in the strainer. The stone may be as small as a grain of salt. Take this to your doctor. This will help your doctor figure out what you can do to try to prevent more kidney stones.  Only take medicine as told by your doctor.  Follow up with your doctor as told.  Get follow-up X-rays as told by your doctor. GET HELP IF: You have pain that gets worse even if you have been taking pain medicine. GET HELP RIGHT AWAY IF:   Your pain does not get better with medicine.  You have a fever or shaking chills.  Your pain increases and gets worse over 18 hours.  You have new belly (abdominal) pain.  You feel faint or pass out.  You are unable to pee. MAKE SURE YOU:   Understand these instructions.  Will watch your condition.  Will get help right away if you are not doing well or get worse. Document Released: 11/09/2007  Document Revised: 01/23/2013 Document Reviewed: 10/24/2012 [facility] Patient Information 2014 [place], [place].

## 2013-09-27 NOTE — Progress Notes (Signed)
Subjective:    Patient ID: Brittney Burke, female    DOB: 31-Aug-1960, 53 y.o.   MRN: [id number]  HPI Patient's a 53 year old Caucasian female presenting for renal stones. Patient started experiencing flank pain symptoms on Monday and was seen on Tuesday at an urgent care and diagnosed with a kidney stone. She was placed on hydrocodone at night to help her sleep and Flomax to help with the passage of the stone and told to followup with her primary care provider if she wasn't feeling better after 3 days. She reports today with symptoms now moved into her lower back and hip area. She is almost out of hydrocodone and Flomax. She states that these have helped a little with her pain. She denies fevers chills nausea vomiting diarrhea shortness of breath.  Outside records review from [facility].  Urinalysis showed positive ketones, positive blood, positive leukocyte esterase.  Culture was done at time, results of which showed no bacterial growth, no infection.  Consulted Dr. Arnoldo Morale for opinion on what steps to take in intervention and imaging.   Review of Systems As per history of present illness and otherwise negative.  Past Medical History  Diagnosis Date  . SKIN CANCER, HX OF 01/05/2007  . VERTIGO 04/01/2010  . Allergy    Past Surgical History  Procedure Laterality Date  . Cervical disc surgery      reports that she has never smoked. She has never used smokeless tobacco. She reports that she drinks about 3.5 ounces of alcohol per week. She reports that she does not use illicit drugs. family history includes Cancer in her mother; Diabetes in her father. No Known Allergies  No change in PFS history since last office visit on 08/21/2013.     Objective:   Physical Exam  Nursing note and vitals reviewed. Constitutional: She is oriented to person, place, and time. She appears well-developed and well-nourished. No distress.  HENT:  Head: Normocephalic and atraumatic.  Eyes:  Conjunctivae and EOM are normal. Pupils are equal, round, and reactive to light.  Neck: Normal range of motion. Neck supple.  Cardiovascular: Normal rate, regular rhythm, normal heart sounds and intact distal pulses.  Exam reveals no gallop and no friction rub.   No murmur heard. Pulmonary/Chest: Effort normal and breath sounds normal. No respiratory distress. She has no wheezes. She has no rales. She exhibits no tenderness.  Abdominal: Soft. Bowel sounds are normal. She exhibits no distension and no mass. There is no tenderness. There is no rebound and no guarding.  Musculoskeletal: Normal range of motion. She exhibits no edema.  Neurological: She is alert and oriented to person, place, and time. She has normal reflexes. No cranial nerve deficit. Coordination normal.  Skin: Skin is warm and dry. No rash noted. She is not diaphoretic. No erythema. No pallor.  Psychiatric: She has a normal mood and affect. Her behavior is normal. Judgment and thought content normal.   Filed Vitals:   09/27/13 0914  BP: 150/90  Pulse: 94  Temp: 98.2 F (36.8 C)  Resp: 20   Lab Results  Component Value Date   WBC 8.2 08/19/2013   HGB 14.6 08/19/2013   HCT 43.5 08/19/2013   PLT 215.0 08/19/2013   GLUCOSE 149* 08/21/2013   CHOL 210* 08/19/2013   TRIG 125.0 08/19/2013   HDL 80.50 08/19/2013   LDLDIRECT 118.5 03/24/2010   LDLCALC 105* 08/19/2013   ALT 19 08/19/2013   AST 20 08/19/2013   NA 136 08/19/2013  K 4.1 08/19/2013   CL 101 08/19/2013   CREATININE 0.6 08/19/2013   BUN 14 08/19/2013   CO2 30 08/19/2013   TSH 1.68 08/19/2013   HGBA1C 5.4 08/21/2013      Assessment & Plan:  Jaya was seen today for fast med follow up.  Diagnoses and associated orders for this visit:  Renal stone - ketorolac (TORADOL) injection 30 mg; Inject 1 mL (30 mg total) into the muscle once. - Cancel: CT Abdomen Pelvis W Wo Contrast; Future - HYDROcodone-acetaminophen (NORCO/VICODIN) 5-325 MG per tablet; Take 1 tablet by mouth at  bedtime as needed for moderate pain (No driving while taking.). Take 1 tablet every 4 to 6 hours as needed. - tamsulosin (FLOMAX) 0.4 MG CAPS capsule; Take 1 capsule (0.4 mg total) by mouth daily. - ketorolac (TORADOL) 10 MG tablet; Take 1 tablet (10 mg total) by mouth every 6 (six) hours as needed (every 6 to 8 hours). - CT Abdomen Pelvis Wo Contrast; Future  Flank pain - ketorolac (TORADOL) injection 30 mg; Inject 1 mL (30 mg total) into the muscle once. - Cancel: CT Abdomen Pelvis W Wo Contrast; Future - HYDROcodone-acetaminophen (NORCO/VICODIN) 5-325 MG per tablet; Take 1 tablet by mouth at bedtime as needed for moderate pain (No driving while taking.). Take 1 tablet every 4 to 6 hours as needed. - tamsulosin (FLOMAX) 0.4 MG CAPS capsule; Take 1 capsule (0.4 mg total) by mouth daily. - ketorolac (TORADOL) 10 MG tablet; Take 1 tablet (10 mg total) by mouth every 6 (six) hours as needed (every 6 to 8 hours). - CT Abdomen Pelvis Wo Contrast; Future    Patient Instructions  You will have a CT scan to check for obstruction today. This will be at [address] at 11am.  Hydrocodone 5-325 by mouth at bedtime for severe pain. No driving while taking this medication.   Tamsulosin 0.4 mg by mouth daily to help increase urination .  Toradol 10 mg tab by mouth every 6-8 hours to help with pain and increase dilation of ureter.   Stopped taking Advil. Don't take Motrin, ibuprofen, Aleve, naproxen while taking Toradol.   Followup is symptoms worsen or do not improve despite treatment.

## 2013-09-30 ENCOUNTER — Ambulatory Visit (AMBULATORY_SURGERY_CENTER): Payer: Self-pay | Admitting: *Deleted

## 2013-09-30 VITALS — Ht 65.0 in | Wt 133.4 lb

## 2013-09-30 DIAGNOSIS — Z1211 Encounter for screening for malignant neoplasm of colon: Secondary | ICD-10-CM

## 2013-09-30 MED ORDER — NA SULFATE-K SULFATE-MG SULF 17.5-3.13-1.6 GM/177ML PO SOLN: ORAL | Status: DC

## 2013-09-30 NOTE — Progress Notes (Signed)
Patient denies any allergies to eggs or soy. Patient denies any problems with anesthesia. No oxygen use at home, no diet/wt loss pills per patient.

## 2013-10-07 ENCOUNTER — Encounter: Payer: Self-pay | Admitting: Gastroenterology

## 2013-10-14 ENCOUNTER — Ambulatory Visit (AMBULATORY_SURGERY_CENTER): Payer: Managed Care, Other (non HMO) | Admitting: Gastroenterology

## 2013-10-14 ENCOUNTER — Encounter: Payer: Self-pay | Admitting: Gastroenterology

## 2013-10-14 VITALS — BP 139/76 | HR 60 | Temp 96.3°F | Resp 15 | Ht 65.0 in | Wt 133.0 lb

## 2013-10-14 DIAGNOSIS — K5730 Diverticulosis of large intestine without perforation or abscess without bleeding: Secondary | ICD-10-CM

## 2013-10-14 DIAGNOSIS — Z1211 Encounter for screening for malignant neoplasm of colon: Secondary | ICD-10-CM

## 2013-10-14 DIAGNOSIS — D126 Benign neoplasm of colon, unspecified: Secondary | ICD-10-CM

## 2013-10-14 MED ORDER — SODIUM CHLORIDE 0.9 % IV SOLN: 500.0000 mL | INTRAVENOUS | Status: DC

## 2013-10-14 NOTE — Progress Notes (Signed)
Report to pacu rn, vss, bbs=clear 

## 2013-10-14 NOTE — Progress Notes (Signed)
Patient denies any allergies to eggs or soy. Patient denies any problems with anesthesia/sedation. Patient denies any oxygen use at home and does not take any diet/weight loss medications.  

## 2013-10-14 NOTE — Op Note (Signed)
[place]  [address] [place], [address]   COLONOSCOPY PROCEDURE REPORT  PATIENT: Brittney, Burke  MR#: [id number] BIRTHDATE: 1960-06-27 , 52  yrs. old GENDER: Female ENDOSCOPIST: Inda Castle, MD REFERRED QB:HALPF Swords, M.D. PROCEDURE DATE:  10/14/2013 PROCEDURE:   Colonoscopy with snare polypectomy First Screening Colonoscopy - Avg.  risk and is 50 yrs.  old or older Yes.  Prior Negative Screening - Now for repeat screening. N/A  History of Adenoma - Now for follow-up colonoscopy & has been > or = to 3 yrs.  N/A  Polyps Removed Today? Yes. ASA CLASS:   Class II INDICATIONS:average risk screening. MEDICATIONS: MAC sedation, administered by CRNA and Propofol (Diprivan) 280 mg IV  DESCRIPTION OF PROCEDURE:   After the risks benefits and alternatives of the procedure were thoroughly explained, informed consent was obtained.  A digital rectal exam revealed no abnormalities of the rectum.   The LB [id number]  endoscope was introduced through the anus and advanced to the cecum, which was identified by both the appendix and ileocecal valve. No adverse events experienced.   The quality of the prep was excellent using Suprep  The instrument was then slowly withdrawn as the colon was fully examined.      COLON FINDINGS: A sessile polyp measuring 3 mm in size was found in the sigmoid colon.  A polypectomy was performed with a cold snare. The resection was complete and the polyp tissue was completely retrieved.   Mild diverticulosis was noted in the sigmoid colon. The colon was otherwise normal.  There was no diverticulosis, inflammation, polyps or cancers unless previously stated. Retroflexed views revealed no abnormalities. The time to cecum=2 minutes 20 seconds.  Withdrawal time=8 minutes 36 seconds.  The scope was withdrawn and the procedure completed. COMPLICATIONS: There were no complications.  ENDOSCOPIC IMPRESSION: 1.   Sessile polyp  measuring 3 mm in size was found in the sigmoid colon; polypectomy was performed with a cold snare 2.   Mild diverticulosis was noted in the sigmoid colon 3.   The colon was otherwise normal  RECOMMENDATIONS: If the polyp(s) removed today are proven to be adenomatous (pre-cancerous) polyps, you will need a repeat colonoscopy in 5 years.  Otherwise you should continue to follow colorectal cancer screening guidelines for "routine risk" patients with colonoscopy in 10 years.  You will receive a letter within 1-2 weeks with the results of your biopsy as well as final recommendations.  Please call my office if you have not received a letter after 3 weeks.   eSigned:  Inda Castle, MD 10/14/2013 1:55 PM   cc:   PATIENT NAME:  Brittney, Burke MR#: [id number]

## 2013-10-14 NOTE — Patient Instructions (Signed)
YOU HAD AN ENDOSCOPIC PROCEDURE TODAY AT THE [facility]: Refer to the procedure report that was given to you for any specific questions about what was found during the examination.  If the procedure report does not answer your questions, please call your gastroenterologist to clarify.  If you requested that your care partner not be given the details of your procedure findings, then the procedure report has been included in a sealed envelope for you to review at your convenience later.  YOU SHOULD EXPECT: Some feelings of bloating in the abdomen. Passage of more gas than usual.  Walking can help get rid of the air that was put into your GI tract during the procedure and reduce the bloating. If you had a lower endoscopy (such as a colonoscopy or flexible sigmoidoscopy) you may notice spotting of blood in your stool or on the toilet paper. If you underwent a bowel prep for your procedure, then you may not have a normal bowel movement for a few days.  DIET: Your first meal following the procedure should be a light meal and then it is ok to progress to your normal diet.  A half-sandwich or bowl of soup is an example of a good first meal.  Heavy or fried foods are harder to digest and may make you feel nauseous or bloated.  Likewise meals heavy in dairy and vegetables can cause extra gas to form and this can also increase the bloating.  Drink plenty of fluids but you should avoid alcoholic beverages for 24 hours.  ACTIVITY: Your care partner should take you home directly after the procedure.  You should plan to take it easy, moving slowly for the rest of the day.  You can resume normal activity the day after the procedure however you should NOT DRIVE or use heavy machinery for 24 hours (because of the sedation medicines used during the test).    SYMPTOMS TO REPORT IMMEDIATELY: A gastroenterologist can be reached at any hour.  During normal business hours, 8:30 AM to 5:00 PM Monday through Friday,  call [phone].  After hours and on weekends, please call the GI answering service at [phone] who will take a message and have the physician on call contact you.   Following lower endoscopy (colonoscopy or flexible sigmoidoscopy):  Excessive amounts of blood in the stool  Significant tenderness or worsening of abdominal pains  Swelling of the abdomen that is new, acute  Fever of 100F or higher   FOLLOW UP: If any biopsies were taken you will be contacted by phone or by letter within the next 1-3 weeks.  Call your gastroenterologist if you have not heard about the biopsies in 3 weeks.  Our staff will call the home number listed on your records the next business day following your procedure to check on you and address any questions or concerns that you may have at that time regarding the information given to you following your procedure. This is a courtesy call and so if there is no answer at the home number and we have not heard from you through the emergency physician on call, we will assume that you have returned to your regular daily activities without incident.  SIGNATURES/CONFIDENTIALITY: You and/or your care partner have signed paperwork which will be entered into your electronic medical record.  These signatures attest to the fact that that the information above on your After Visit Summary has been reviewed and is understood.  Full responsibility of the confidentiality of   this discharge information lies with you and/or your care-partner.   Resume medications. Information given on polyps,diverticulosis and high fiber diet with discharge instructions. 

## 2013-10-14 NOTE — Progress Notes (Signed)
Called to room to assist during endoscopic procedure.  Patient ID and intended procedure confirmed with present staff. Received instructions for my participation in the procedure from the performing physician.  

## 2013-10-15 ENCOUNTER — Telehealth: Payer: Self-pay

## 2013-10-15 NOTE — Telephone Encounter (Signed)
  Follow up Call-  Call back number 10/14/2013  Post procedure Call Back phone  # [phone]  Permission to leave phone message Yes     Patient questions:  Do you have a fever, pain , or abdominal swelling? no Pain Score  0 *  Have you tolerated food without any problems? yes  Have you been able to return to your normal activities? yes  Do you have any questions about your discharge instructions: Diet   no Medications  no Follow up visit  no  Do you have questions or concerns about your Care? no  Actions: * If pain score is 4 or above: No action needed, pain <4.

## 2013-10-18 ENCOUNTER — Encounter: Payer: Self-pay | Admitting: Gastroenterology

## 2014-07-07 ENCOUNTER — Ambulatory Visit (INDEPENDENT_AMBULATORY_CARE_PROVIDER_SITE_OTHER): Payer: Managed Care, Other (non HMO) | Admitting: Family Medicine

## 2014-07-07 ENCOUNTER — Encounter: Payer: Self-pay | Admitting: Family Medicine

## 2014-07-07 VITALS — BP 128/80 | HR 88 | Temp 98.2°F | Ht 64.5 in | Wt 131.9 lb

## 2014-07-07 DIAGNOSIS — J0100 Acute maxillary sinusitis, unspecified: Secondary | ICD-10-CM

## 2014-07-07 MED ORDER — DOXYCYCLINE HYCLATE 100 MG PO TABS: 100.0000 mg | ORAL_TABLET | Freq: Two times a day (BID) | ORAL | Status: DC

## 2014-07-07 NOTE — Progress Notes (Signed)
HPI:  Acute visit for:  Sinusitis: -started 2 weeks ago -hx of allergies and sinus issues worsened about 1 month ago - taking zyrtec -hx of sinus infections -symptoms: thick nasal congestion, sinus pain in maxillary sinuses R> L, ear pressure and pain, scratchy throat -denies: fevers, NVD, SOB -coworkers with cold recently -no flu exposure, no recent travel outside country  ROS: See pertinent positives and negatives per HPI.  Past Medical History  Diagnosis Date  . SKIN CANCER, HX OF 01/05/2007  . VERTIGO 04/01/2010  . Allergy   . Kidney stone   . GERD (gastroesophageal reflux disease)     Past Surgical History  Procedure Laterality Date  . Cervical disc surgery    . Breast cyst excision  age 15    Family History  Problem Relation Age of Onset  . Cancer Mother     breast  . Diabetes Father   . Colon cancer Paternal Grandfather 29    History   Social History  . Marital Status: Married    Spouse Name: N/A    Number of Children: N/A  . Years of Education: N/A   Social History Main Topics  . Smoking status: Never Smoker   . Smokeless tobacco: Never Used  . Alcohol Use: 3.5 oz/week    7 drink(s) per week  . Drug Use: No  . Sexual Activity: None   Other Topics Concern  . None   Social History Narrative     Current outpatient prescriptions:  .  aspirin 81 MG tablet, Take 81 mg by mouth daily., Disp: , Rfl:  .  cetirizine (ZYRTEC) 10 MG tablet, Take 10 mg by mouth as needed.  , Disp: , Rfl:  .  Cholecalciferol (VITAMIN D3) 1000 UNITS CAPS, Take by mouth daily. , Disp: , Rfl:  .  ranitidine (ZANTAC) 150 MG tablet, Take 150 mg by mouth as needed.  , Disp: , Rfl:  .  doxycycline (VIBRA-TABS) 100 MG tablet, Take 1 tablet (100 mg total) by mouth 2 (two) times daily., Disp: 20 tablet, Rfl: 0  EXAM:  Filed Vitals:   07/07/14 1356  BP: 128/80  Pulse: 88  Temp: 98.2 F (36.8 C)    Body mass index is 22.3 kg/(m^2).  GENERAL: vitals reviewed and listed  above, alert, oriented, appears well hydrated and in no acute distress  HEENT: atraumatic, conjunttiva clear, no obvious abnormalities on inspection of external nose and ears, normal appearance of ear canals and TMs, thick nasal congestion, mild post oropharyngeal erythema with PND, no tonsillar edema or exudate, no sinus TTP  NECK: no obvious masses on inspection  LUNGS: clear to auscultation bilaterally, no wheezes, rales or rhonchi, good air movement  CV: HRRR, no peripheral edema  MS: moves all extremities without noticeable abnormality  PSYCH: pleasant and cooperative, no obvious depression or anxiety  ASSESSMENT AND PLAN:  Discussed the following assessment and plan:  Acute maxillary sinusitis, recurrence not specified - Plan: doxycycline (VIBRA-TABS) 100 MG tablet  -we discussed possible serious and likely etiologies, workup and treatment, treatment risks and return precautions -after this discussion, Reshanda opted for abx and follow up as needed for likely sinusitis -of course, we advised Roseanna  to return or notify a doctor immediately if symptoms worsen or persist or new concerns arise.  -Patient advised to return or notify a doctor immediately if symptoms worsen or persist or new concerns arise.  Patient Instructions  INSTRUCTIONS FOR UPPER RESPIRATORY INFECTION:  -plenty of rest and fluids  -As  we discussed, we have prescribed a new medication for you at this appointment. We discussed the common and serious potential adverse effects of this medication and you can review these and more with the pharmacist when you pick up your medication.  Please follow the instructions for use carefully and notify us immediately if you have any problems taking this medication.  -nasal saline wash 2-3 times daily (use prepackaged nasal saline or bottled/distilled water if making your own)   -can use AFRIN nasal spray for drainage and nasal congestion - but do NOT use longer then 3-4  days  -can use tylenol or ibuprofen as directed for aches and sorethroat  -in the winter time, using a humidifier at night is helpful (please follow cleaning instructions)  -if you are taking a cough medication - use only as directed, may also try a teaspoon of honey to coat the throat and throat lozenges  -for sore throat, salt water gargles can help  -follow up if you have fevers, facial pain, tooth pain, difficulty breathing or are worsening or not getting better in 5-7 days      KIM, HANNAH R.

## 2014-07-07 NOTE — Patient Instructions (Signed)
INSTRUCTIONS FOR UPPER RESPIRATORY INFECTION:  -plenty of rest and fluids  -As we discussed, we have prescribed a new medication for you at this appointment. We discussed the common and serious potential adverse effects of this medication and you can review these and more with the pharmacist when you pick up your medication.  Please follow the instructions for use carefully and notify us immediately if you have any problems taking this medication.  -nasal saline wash 2-3 times daily (use prepackaged nasal saline or bottled/distilled water if making your own)   -can use AFRIN nasal spray for drainage and nasal congestion - but do NOT use longer then 3-4 days  -can use tylenol or ibuprofen as directed for aches and sorethroat  -in the winter time, using a humidifier at night is helpful (please follow cleaning instructions)  -if you are taking a cough medication - use only as directed, may also try a teaspoon of honey to coat the throat and throat lozenges  -for sore throat, salt water gargles can help  -follow up if you have fevers, facial pain, tooth pain, difficulty breathing or are worsening or not getting better in 5-7 days

## 2014-07-07 NOTE — Progress Notes (Signed)
Pre visit review using our clinic review tool, if applicable. No additional management support is needed unless otherwise documented below in the visit note. 

## 2014-10-21 ENCOUNTER — Other Ambulatory Visit: Payer: Self-pay

## 2014-10-21 DIAGNOSIS — Z1231 Encounter for screening mammogram for malignant neoplasm of breast: Secondary | ICD-10-CM

## 2014-11-11 ENCOUNTER — Ambulatory Visit: Payer: Managed Care, Other (non HMO)

## 2014-11-14 ENCOUNTER — Ambulatory Visit
Admission: RE | Admit: 2014-11-14 | Discharge: 2014-11-14 | Disposition: A | Discharge status: Home / Self Care | Payer: Managed Care, Other (non HMO) | Source: Ambulatory Visit

## 2014-11-14 DIAGNOSIS — Z1231 Encounter for screening mammogram for malignant neoplasm of breast: Secondary | ICD-10-CM

## 2015-06-11 ENCOUNTER — Telehealth: Payer: Self-pay | Admitting: Family Medicine

## 2015-06-11 NOTE — Telephone Encounter (Signed)
Patient Name: Brittney Burke  DOB: [birth year]    Initial Comment Caller States right foot is swollen on top, ankle is sore, painful to walk on   Nurse Assessment  Nurse: Mallie Mussel, RN, Alveta Heimlich Date/Time Eilene Ghazi Time): 06/11/2015 11:21:11 AM  Confirm and document reason for call. If symptomatic, describe symptoms. ---Caller states that the top of her right foot is swollen and tender. She has some pain in her ankle also. The swelling is more to the side of the foot. Denies injury and insect bites. She states that this began Saturday after doing "a lot of walking." There is no pain if she is not walking, but walking her pain is 8 on 0-10 scale. Denies fever and red skin.  Has the patient traveled out of the country within the last 30 days? ---No  Does the patient have any new or worsening symptoms? ---Yes  Will a triage be completed? ---Yes  Related visit to physician within the last 2 weeks? ---No  Does the PT have any chronic conditions? (i.e. diabetes, asthma, etc.) ---No  Is this a behavioral health or substance abuse call? ---No     Guidelines    Guideline Title Affirmed Question Affirmed Notes  Foot Pain [1] Swollen foot AND [2] no fever (Exceptions: localized bump from bunions, calluses, insect bite, sting)    Final Disposition User   See Physician within 24 Hours Mallie Mussel, RN, Alveta Heimlich    Comments  Appointment scheduled for tomorrow at 1:00p with Dr. Alysia Penna.   Referrals  REFERRED TO PCP OFFICE   Disagree/Comply: Comply

## 2015-06-11 NOTE — Telephone Encounter (Signed)
FYI - appointment 06/12/15

## 2015-06-12 ENCOUNTER — Ambulatory Visit (INDEPENDENT_AMBULATORY_CARE_PROVIDER_SITE_OTHER): Payer: Managed Care, Other (non HMO) | Admitting: Family Medicine

## 2015-06-12 ENCOUNTER — Encounter: Payer: Self-pay | Admitting: Family Medicine

## 2015-06-12 VITALS — BP 140/88 | HR 81 | Temp 98.5°F | Ht 64.5 in | Wt 143.0 lb

## 2015-06-12 DIAGNOSIS — M25571 Pain in right ankle and joints of right foot: Secondary | ICD-10-CM

## 2015-06-12 NOTE — Progress Notes (Signed)
Pre visit review using our clinic review tool, if applicable. No additional management support is needed unless otherwise documented below in the visit note. 

## 2015-06-12 NOTE — Progress Notes (Signed)
   Subjective:    Patient ID: Brittney Burke, female    DOB: 1961/04/27, 55 y.o.   MRN: [id number]  HPI Here for 5 days of pain and swelling in the right foot. No redness or warmth. No recent trauma but this started after a long day of shopping. Tylenol and ice have not helped.    Review of Systems  Constitutional: Negative.   Musculoskeletal: Positive for arthralgias and gait problem.       Objective:   Physical Exam  Constitutional: She appears well-developed and well-nourished.  Musculoskeletal:  The right foot appears normal with no swelling or erythema or warmth. 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 Less likely would be a stress fracture. She will stay off her feet this weekend. When she is up walking, she will wear supportive athletic shoes. Take 800 mg of Ibuprofen 3 or 4 times a day. Recheck prn

## 2015-06-15 ENCOUNTER — Ambulatory Visit: Payer: Managed Care, Other (non HMO) | Admitting: Family Medicine

## 2015-06-23 ENCOUNTER — Encounter: Payer: Self-pay | Admitting: Family Medicine

## 2015-06-23 ENCOUNTER — Ambulatory Visit (INDEPENDENT_AMBULATORY_CARE_PROVIDER_SITE_OTHER): Payer: Managed Care, Other (non HMO) | Admitting: Family Medicine

## 2015-06-23 VITALS — BP 155/102 | HR 84 | Temp 98.3°F | Ht 64.5 in | Wt 142.0 lb

## 2015-06-23 DIAGNOSIS — M79671 Pain in right foot: Secondary | ICD-10-CM | POA: Diagnosis not present

## 2015-06-23 NOTE — Progress Notes (Signed)
   Subjective:    Patient ID: Brittney Burke, female    DOB: [birth year], 55 y.o.   MRN: [id number]  HPI Here to follow up right foot pain. We met 11 days ago when her foot was swollen and very painful. She has been wearing supportive shoes, staying off her feet, and taking Advil, and the foot is much better. She still has some pain however.    Review of Systems  Constitutional: Negative.   Musculoskeletal: Positive for joint swelling and arthralgias.       Objective:   Physical Exam  Constitutional: She appears well-developed and well-nourished.  Musculoskeletal:  Right forefoot is swollen but less so than before. She is tender between the 3rd and 4th MTP joints.           Assessment & Plan:  This is consistent with a Mortons neuroma. Refer to Podiatry.

## 2015-06-23 NOTE — Progress Notes (Signed)
Pre visit review using our clinic review tool, if applicable. No additional management support is needed unless otherwise documented below in the visit note. 

## 2015-07-01 ENCOUNTER — Ambulatory Visit (INDEPENDENT_AMBULATORY_CARE_PROVIDER_SITE_OTHER): Payer: Managed Care, Other (non HMO) | Admitting: Family Medicine

## 2015-07-01 ENCOUNTER — Encounter: Payer: Self-pay | Admitting: Family Medicine

## 2015-07-01 VITALS — BP 130/87 | HR 87 | Temp 98.7°F | Wt 141.0 lb

## 2015-07-01 DIAGNOSIS — R739 Hyperglycemia, unspecified: Secondary | ICD-10-CM

## 2015-07-01 DIAGNOSIS — Z86010 Personal history of colonic polyps: Secondary | ICD-10-CM | POA: Insufficient documentation

## 2015-07-01 DIAGNOSIS — Z860101 Personal history of adenomatous and serrated colon polyps: Secondary | ICD-10-CM | POA: Insufficient documentation

## 2015-07-01 DIAGNOSIS — Z85820 Personal history of malignant melanoma of skin: Secondary | ICD-10-CM

## 2015-07-01 DIAGNOSIS — J309 Allergic rhinitis, unspecified: Secondary | ICD-10-CM | POA: Insufficient documentation

## 2015-07-01 DIAGNOSIS — M19072 Primary osteoarthritis, left ankle and foot: Secondary | ICD-10-CM | POA: Insufficient documentation

## 2015-07-01 DIAGNOSIS — Z0000 Encounter for general adult medical examination without abnormal findings: Secondary | ICD-10-CM | POA: Diagnosis not present

## 2015-07-01 DIAGNOSIS — E559 Vitamin D deficiency, unspecified: Secondary | ICD-10-CM | POA: Diagnosis not present

## 2015-07-01 DIAGNOSIS — K219 Gastro-esophageal reflux disease without esophagitis: Secondary | ICD-10-CM | POA: Insufficient documentation

## 2015-07-01 DIAGNOSIS — Z20828 Contact with and (suspected) exposure to other viral communicable diseases: Secondary | ICD-10-CM

## 2015-07-01 NOTE — Patient Instructions (Addendum)
We will call you within a week about your referral to [facility]. If you do not hear within 2 weeks, give [place] a call.   Schedule a lab visit at the [facility] [place]. Return for future fasting labs. Nothing but water after midnight please.

## 2015-07-01 NOTE — Progress Notes (Addendum)
Brittney Reddish, MD Phone: [phone]  Subjective:  Patient presents today to establish care with me as their new primary care provider. Patient was formerly a patient of Dr. Leanne Burke. Chief complaint-noted.   See problem oriented charting ROS- full  review of systems was completed and negative with pertinent negatives- No chest pain or shortness of breath. No headache or blurry vision.   The following were reviewed and entered/updated in epic: Past Medical History  Diagnosis Date  . SKIN CANCER, HX OF 01/05/2007  . VERTIGO 04/01/2010  . Allergy   . Kidney stone   . GERD (gastroesophageal reflux disease)    Patient Active Problem List   Diagnosis Date Noted  . Hyperglycemia 07/01/2015    Priority: Medium  . History of melanoma 01/05/2007    Priority: Medium  . Allergic rhinitis 07/01/2015    Priority: Low  . Vitamin D deficiency 07/01/2015    Priority: Low  . GERD (gastroesophageal reflux disease) 07/01/2015    Priority: Low  . Arthritis of left ankle 07/01/2015    Priority: Low  . History of adenomatous polyp of colon 07/01/2015    Priority: Low  . VERTIGO 04/01/2010    Priority: Low   Past Surgical History  Procedure Laterality Date  . Cervical disc surgery    . Breast cyst excision  age 54    benign    Family History  Problem Relation Age of Onset  . Breast cancer Mother   . Diabetes Father     mother  . Colon cancer Paternal Grandfather 51  . Heart attack Father 87  . Transient ischemic attack Father 1  . Leukemia Brother 50    CLL   Medications- reviewed and updated Current Outpatient Prescriptions  Medication Sig Dispense Refill  . aspirin 81 MG tablet Take 81 mg by mouth daily.    . cetirizine (ZYRTEC) 10 MG tablet Take 10 mg by mouth as needed.      . Cholecalciferol (VITAMIN D3) 1000 UNITS CAPS Take by mouth daily.     . ranitidine (ZANTAC) 150 MG tablet Take 150 mg by mouth as needed.       Allergies-reviewed and updated No Known  Allergies  Social History   Social History  . Marital Status: Married    Spouse Name: N/A  . Number of Children: N/A  . Years of Education: N/A   Social History Main Topics  . Smoking status: Never Smoker   . Smokeless tobacco: Never Used  . Alcohol Use: 4.2 oz/week    7 Standard drinks or equivalent per week     Comment: glass of wine each day  . Drug Use: No  . Sexual Activity: Not Asked   Other Topics Concern  . None   Social History Narrative   Married. No children. Malt[place] 12 years old.       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    Yoga 2x a week up to 5 times a week.     ROS--See HPI   Objective: BP 130/87 mmHg  Pulse 87  Temp(Src) 98.7 F (37.1 C)  Wt 141 lb (63.957 kg) Gen: NAD, resting comfortably HEENT: Mucous membranes are moist. Oropharynx normal Neck: no thyromegaly CV: RRR no murmurs rubs or gallops Lungs: CTAB no crackles, wheeze, rhonchi Abdomen: soft/nontender/nondistended/normal bowel sounds. No rebound or guarding.  Ext: no edema Skin: warm,  dry Neuro: grossly normal, moves all extremities, PERRLA  Assessment/Plan:  55 y.o. female presenting for annual physical.  Health Maintenance counseling: 1. Anticipatory guidance: Patient counseled regarding regular dental exams- every 3 months, eye exams- Dr. Arlyce Dice, wearing seatbelts.  2. Risk factor reduction:  Advised patient of need for regular exercise and diet rich and fruits and vegetables to reduce risk of heart attack and stroke. Target goal below 135 at home.  3. Immunizations/screenings/ancillary studies- declines flu Health Maintenance Due  Topic Date Due  . Hepatitis C Screening - opts in 06/11/[birth year]   4. Cervical cancer screening- done 2015, repeat 2018, sees GYN 5. Breast cancer screening-  breast exam through [facility] and mammogram 11/2014 with yearly due to family history 6.  Colon cancer screening - 2015 with 5 year epeat 7. Skin cancer screening- history melanoma-refer at this time for repeat full body exam  Return in about 1 year (around 06/30/2016) for physical. advised GYN 2018, see me 2018 Return precautions advised.   Orders Placed This Encounter  Procedures  . CBC with Differential/Platelet    Standing Status: Future     Number of Occurrences:      Standing Expiration Date: 06/30/2016  . Comprehensive metabolic panel    [facility]    Standing Status: Future     Number of Occurrences:      Standing Expiration Date: 06/30/2016    Order Specific Question:  Has the patient fasted?    Answer:  No  . Lipid panel    [facility]    Standing Status: Future     Number of Occurrences:      Standing Expiration Date: 06/30/2016    Order Specific Question:  Has the patient fasted?    Answer:  No  . Hemoglobin A1c    [facility]    Standing Status: Future     Number of Occurrences:      Standing Expiration Date: 06/30/2016  . Hepatitis C antibody, reflex    [facility]    Standing Status: Future     Number of Occurrences:      Standing Expiration Date: 06/30/2016  . VITAMIN D 25 Hydroxy (Vit-D Deficiency, Fractures)    [facility]    Standing Status: Future     Number of Occurrences:      Standing Expiration Date: 06/30/2016  . Ambulatory referral to Dermatology    Referral Priority:  Routine    Referral Type:  Consultation    Referral Reason:  Specialty Services Required    Requested Specialty:  Dermatology    Number of Visits Requested:  1

## 2015-07-02 ENCOUNTER — Ambulatory Visit (INDEPENDENT_AMBULATORY_CARE_PROVIDER_SITE_OTHER): Payer: Managed Care, Other (non HMO) | Admitting: Podiatry

## 2015-07-02 ENCOUNTER — Encounter: Payer: Self-pay | Admitting: Podiatry

## 2015-07-02 ENCOUNTER — Ambulatory Visit (INDEPENDENT_AMBULATORY_CARE_PROVIDER_SITE_OTHER): Payer: Managed Care, Other (non HMO)

## 2015-07-02 VITALS — BP 138/90 | HR 83 | Resp 16 | Ht 65.0 in | Wt 140.0 lb

## 2015-07-02 DIAGNOSIS — M79671 Pain in right foot: Secondary | ICD-10-CM

## 2015-07-02 DIAGNOSIS — M8430XA Stress fracture, unspecified site, initial encounter for fracture: Secondary | ICD-10-CM

## 2015-07-02 NOTE — Progress Notes (Signed)
   Subjective:    Patient ID: Brittney Burke, female    DOB: Mar 12, 1961, 55 y.o.   MRN: [id number]  HPI Patient presents with foot pain in their right foot; dorsal (below 4th & 5th toes); swollen. Pt stated, "Painful to walk on foot"; Since Dec. 31, 2016.   Review of Systems  HENT: Positive for hearing loss and sinus pressure.   Neurological: Positive for dizziness.  All other systems reviewed and are negative.      Objective:   Physical Exam        Assessment & Plan:

## 2015-07-02 NOTE — Progress Notes (Signed)
Subjective:     Patient ID: Brittney Burke, female   DOB: 11/17/1960, 55 y.o.   MRN: [id number]  HPI patient states I've had a lot of swelling and pain in my left foot for 3 or 4 weeks and I saw my family doctor who put me on an anti-inflammatory which helped a little bit with the swelling an discomfort but it's very painful now and it almost feels like I'm walking on a broken bone   Review of Systems  All other systems reviewed and are negative.      Objective:   Physical Exam  Constitutional: She is oriented to person, place, and time.  Cardiovascular: Intact distal pulses.   Musculoskeletal: Normal range of motion.  Neurological: She is oriented to person, place, and time.  Skin: Skin is warm.  Nursing note and vitals reviewed.  neurovascular status intact muscle strength adequate range of motion within normal limits with patient found to have exquisite discomfort dorsal aspect right foot around the fourth metatarsal shaft with edema present and no active drainage. Patient's found have good digital perfusion and is well oriented 3     Assessment:      probable stress fracture right fourth metatarsal    Plan:      H&P and x-rays reviewed. I explained stress fracture and the fact it's at the neck of the bone and I did apply air fracture walker with instructions on usage along with compression and elevation. This  Should heal uneventfully and I explained to patient that the possibility for adjacent fracture is present and that this probably will take about a month to heal. Reappoint in 3 weeks to reevaluate or earlier if necessary

## 2015-07-10 ENCOUNTER — Other Ambulatory Visit (INDEPENDENT_AMBULATORY_CARE_PROVIDER_SITE_OTHER): Payer: Managed Care, Other (non HMO)

## 2015-07-10 ENCOUNTER — Other Ambulatory Visit: Payer: Self-pay | Admitting: Family Medicine

## 2015-07-10 DIAGNOSIS — E559 Vitamin D deficiency, unspecified: Secondary | ICD-10-CM

## 2015-07-10 DIAGNOSIS — R739 Hyperglycemia, unspecified: Secondary | ICD-10-CM

## 2015-07-10 DIAGNOSIS — Z0000 Encounter for general adult medical examination without abnormal findings: Secondary | ICD-10-CM

## 2015-07-10 DIAGNOSIS — Z20828 Contact with and (suspected) exposure to other viral communicable diseases: Secondary | ICD-10-CM

## 2015-07-10 LAB — CBC WITH DIFFERENTIAL/PLATELET
BASOS ABS: 0 10*3/uL (ref 0.0–0.1)
BASOS PCT: 0.7 % (ref 0.0–3.0)
EOS ABS: 0.3 10*3/uL (ref 0.0–0.7)
Eosinophils Relative: 5.6 % — ABNORMAL HIGH (ref 0.0–5.0)
HEMATOCRIT: 43.3 % (ref 36.0–46.0)
HEMOGLOBIN: 14.7 g/dL (ref 12.0–15.0)
Lymphocytes Relative: 32 % (ref 12.0–46.0)
Lymphs Abs: 1.9 10*3/uL (ref 0.7–4.0)
MCHC: 33.9 g/dL (ref 30.0–36.0)
MCV: 97.7 fl (ref 78.0–100.0)
Monocytes Absolute: 0.5 10*3/uL (ref 0.1–1.0)
Monocytes Relative: 8.3 % (ref 3.0–12.0)
Neutro Abs: 3.2 10*3/uL (ref 1.4–7.7)
Neutrophils Relative %: 53.4 % (ref 43.0–77.0)
PLATELETS: 225 10*3/uL (ref 150.0–400.0)
RBC: 4.43 Mil/uL (ref 3.87–5.11)
RDW: 12.2 % (ref 11.5–15.5)
WBC: 5.9 10*3/uL (ref 4.0–10.5)

## 2015-07-10 LAB — COMPREHENSIVE METABOLIC PANEL
ALBUMIN: 4.5 g/dL (ref 3.5–5.2)
ALT: 21 U/L (ref 0–35)
AST: 20 U/L (ref 0–37)
Alkaline Phosphatase: 79 U/L (ref 39–117)
BILIRUBIN TOTAL: 0.6 mg/dL (ref 0.2–1.2)
BUN: 15 mg/dL (ref 6–23)
CHLORIDE: 103 meq/L (ref 96–112)
CO2: 30 meq/L (ref 19–32)
CREATININE: 0.59 mg/dL (ref 0.40–1.20)
Calcium: 9.8 mg/dL (ref 8.4–10.5)
GFR: 112.01 mL/min (ref >60.00)
GLUCOSE: 115 mg/dL — AB (ref 70–99)
POTASSIUM: 4.4 meq/L (ref 3.5–5.1)
SODIUM: 142 meq/L (ref 135–145)
Total Protein: 7.2 g/dL (ref 6.0–8.3)

## 2015-07-10 LAB — HEMOGLOBIN A1C: Hgb A1c MFr Bld: 5.5 % (ref 4.6–6.5)

## 2015-07-10 LAB — LIPID PANEL
CHOL/HDL RATIO: 3
Cholesterol: 229 mg/dL — ABNORMAL HIGH (ref 0–200)
HDL: 84 mg/dL (ref >39.00)
LDL Cholesterol: 125 mg/dL — ABNORMAL HIGH (ref 0–99)
NONHDL: 144.82
Triglycerides: 98 mg/dL (ref 0.0–149.0)
VLDL: 19.6 mg/dL (ref 0.0–40.0)

## 2015-07-10 LAB — VITAMIN D 25 HYDROXY (VIT D DEFICIENCY, FRACTURES): VITD: 36.61 ng/mL (ref 30.00–100.00)

## 2015-07-11 LAB — HEPATITIS C ANTIBODY: HCV AB: NEGATIVE

## 2015-07-23 ENCOUNTER — Ambulatory Visit (INDEPENDENT_AMBULATORY_CARE_PROVIDER_SITE_OTHER): Payer: Managed Care, Other (non HMO) | Admitting: Podiatry

## 2015-07-23 ENCOUNTER — Encounter: Payer: Self-pay | Admitting: Podiatry

## 2015-07-23 ENCOUNTER — Ambulatory Visit (INDEPENDENT_AMBULATORY_CARE_PROVIDER_SITE_OTHER): Payer: Managed Care, Other (non HMO)

## 2015-07-23 VITALS — BP 135/87 | HR 87 | Resp 16

## 2015-07-23 DIAGNOSIS — M8430XA Stress fracture, unspecified site, initial encounter for fracture: Secondary | ICD-10-CM | POA: Diagnosis not present

## 2015-07-23 DIAGNOSIS — M79671 Pain in right foot: Secondary | ICD-10-CM

## 2015-07-23 NOTE — Progress Notes (Signed)
Subjective:     Patient ID: Brittney Burke, female   DOB: 08/14/1960, 55 y.o.   MRN: [id number]  HPI patient states I'm doing pretty well with swelling still noted but improved   Review of Systems     Objective:   Physical Exam Neurovascular status intact everything else within normal limits with patient's fourth metatarsal distal portion still moderately swollen with minimal discomfort when palpated    Assessment:     Improvement on significant stress fracture right    Plan:     Reviewed x-rays and allow patient to gradually increase activity explain what to do if further pain should occur  X-ray report indicates that there has been a fracture which is gradually healing fourth metatarsal neck right

## 2016-01-12 ENCOUNTER — Other Ambulatory Visit: Payer: Self-pay | Admitting: Family Medicine

## 2016-01-12 DIAGNOSIS — Z1231 Encounter for screening mammogram for malignant neoplasm of breast: Secondary | ICD-10-CM

## 2016-01-26 ENCOUNTER — Ambulatory Visit
Admission: RE | Admit: 2016-01-26 | Discharge: 2016-01-26 | Disposition: A | Discharge status: Home / Self Care | Payer: Managed Care, Other (non HMO) | Source: Ambulatory Visit | Attending: Family Medicine | Admitting: Family Medicine

## 2016-01-26 DIAGNOSIS — Z1231 Encounter for screening mammogram for malignant neoplasm of breast: Secondary | ICD-10-CM

## 2016-04-11 ENCOUNTER — Ambulatory Visit (INDEPENDENT_AMBULATORY_CARE_PROVIDER_SITE_OTHER): Payer: Managed Care, Other (non HMO) | Admitting: Otolaryngology

## 2016-07-04 DIAGNOSIS — J324 Chronic pansinusitis: Secondary | ICD-10-CM | POA: Insufficient documentation

## 2016-08-12 ENCOUNTER — Ambulatory Visit (INDEPENDENT_AMBULATORY_CARE_PROVIDER_SITE_OTHER): Payer: Managed Care, Other (non HMO) | Admitting: Family Medicine

## 2016-08-12 ENCOUNTER — Telehealth: Payer: Self-pay

## 2016-08-12 ENCOUNTER — Encounter: Payer: Self-pay | Admitting: Family Medicine

## 2016-08-12 VITALS — BP 132/86 | HR 94 | Temp 98.5°F | Ht 65.0 in | Wt 144.8 lb

## 2016-08-12 DIAGNOSIS — R079 Chest pain, unspecified: Secondary | ICD-10-CM | POA: Diagnosis not present

## 2016-08-12 DIAGNOSIS — R200 Anesthesia of skin: Secondary | ICD-10-CM | POA: Diagnosis not present

## 2016-08-12 MED ORDER — ATORVASTATIN CALCIUM 40 MG PO TABS: 40.0000 mg | ORAL_TABLET | Freq: Every day | ORAL | 3 refills | Status: DC

## 2016-08-12 MED ORDER — CLOPIDOGREL BISULFATE 75 MG PO TABS: 75.0000 mg | ORAL_TABLET | Freq: Every day | ORAL | 3 refills | Status: DC

## 2016-08-12 NOTE — Telephone Encounter (Signed)
Called and spoke to patient to verify that left lower lip numbness was not something that had just started. She states her lip has had numbness off and on since Monday. She denies and injuries or discoloration.   She does state she has had some dizziness, light headiness, and fatigue. She states she does have vertigo but states this is different. Her vertigo is positional and this is not.  Dr. Yong Channel made aware.

## 2016-08-12 NOTE — Progress Notes (Signed)
Pre visit review using our clinic review tool, if applicable. No additional management support is needed unless otherwise documented below in the visit note. 

## 2016-08-12 NOTE — Patient Instructions (Addendum)
Concern for stroke with persistent left lip numbness over 24 hours.   Other than lip numbness- no other neurological findings on exam.   Will get Echocardiogram, carotid dopplers, MR brain. EKG reassuring- no heart rhythm issue noted. We also discussed possibility of atrial fibrillation as cause. If ever had recurrence then would certainly consider longer term cardiac monitoring  Would suspect a very small CVA if present- do not think this would likely be related to a bleed.   We opted to pursue MRI over CT to start as CT may miss CVA and would need MRI ultimately anyway.   Will start plavix as well as atorvastatin given mild hyperlipidemia. Stop aspirin for now.   We discussed bleeding risk of plavix and should alert [place] if any blood in stool or dark black stool. Also discussed if ANY worsening or new symptoms- to call 911 immediately

## 2016-08-12 NOTE — Progress Notes (Signed)
Subjective:  Brittney Burke is a 56 y.o. year old very pleasant female patient who presents for/with See problem oriented charting ROS- No facial or extremity weakness. No slurred words or trouble swallowing. no blurry vision or double vision. No paresthesias outside of left lip. No confusion or word finding difficulties.    Past Medical History-  Patient Active Problem List   Diagnosis Date Noted  . Hyperglycemia 07/01/2015    Priority: Medium  . History of melanoma 01/05/2007    Priority: Medium  . Allergic rhinitis 07/01/2015    Priority: Low  . Vitamin D deficiency 07/01/2015    Priority: Low  . GERD (gastroesophageal reflux disease) 07/01/2015    Priority: Low  . Arthritis of left ankle 07/01/2015    Priority: Low  . History of adenomatous polyp of colon 07/01/2015    Priority: Low  . VERTIGO 04/01/2010    Priority: Low    Medications- reviewed and updated Current Outpatient Prescriptions  Medication Sig Dispense Refill  . aspirin 81 MG tablet Take 81 mg by mouth daily.    . cetirizine (ZYRTEC) 10 MG tablet Take 10 mg by mouth as needed.      . Cholecalciferol (VITAMIN D3) 1000 UNITS CAPS Take by mouth daily.     . Probiotic Product (PROBIOTIC DAILY PO) Take 1 tablet by mouth daily.    . ranitidine (ZANTAC) 150 MG tablet Take 150 mg by mouth as needed.       Objective: BP 132/86 (BP Location: Left Arm, Patient Position: Sitting, Cuff Size: Normal)   Pulse 94   Temp 98.5 F (36.9 C) (Oral)   Ht 5\' 5"  (1.651 m)   Wt 144 lb 12.8 oz (65.7 kg)   SpO2 97%   BMI 24.10 kg/m  Gen: NAD, resting comfortably CV: RRR no murmurs rubs or gallops Lungs: CTAB no crackles, wheeze, rhonchi Abdomen: soft/nontender/nondistended/normal bowel sounds. No rebound or guarding.  Ext: no edema Skin: warm, dry Neuro: CN II-XII intact with exception of left lower lip mild numbness- feels slightly different with light touch and temperature, sensation and reflexes normal throughout, 5/5 muscle  strength in bilateral upper and lower extremities. Normal finger to nose. Normal rapid alternating movements. No pronator drift. Normal romberg. Normal gait.   EKG: sinus rhythm with rate 82, normal axis, normal intervals, no hypertrophy, no st or t wave changes. Reassuring ekg.   Assessment/Plan:  Lip numbness - Plan: VAS US CAROTID, ECHOCARDIOGRAM COMPLETE, MR Brain W Wo Contrast Chest pain, unspecified type - Plan: EKG 12-Lead S: started Monday night with numbness/tingling on left lower lip and into the chin. Took 3 baby aspirin went to bed- next morning had completely resolved then came back mid morning. Has persisted since that time. Pretty stable. Mild throbbing sensation.  Takes baby aspirin every day. Mild lightheadedness but has vertigo issues at baseline. Mild hyperlipidemia- untreated. No rash.   No palpitations  Same thing happened to her father- but his blood pressure was found. MRI showed stroke and he was placed on blood pressure.   Did have a short period of left sided upper chest pain a few weeks ago but no recurrence- we got EKG for this and to make sure no arhythmia .   A/P: Concern for CVA (mild hyperlipidemia, dad with stroke in exact distribution)- persistent left lip numbness over 24 hours. Other than lip numbness- no other neurological findings on exam. Will get Echo, carotid, MR brain. Would suspect a very small CVA if present- do not  think this would likely be hemorrhagic. We opted to pursue MRI over CT to start as CT may miss CVA and would need MRI ultimately anyway. Will start plavix as well as atorvastatin given mild hyperlipidemia. Stop aspirin for now as this would be an aspirin failure if truly CVA. We discussed bleeding risk of plavix and should alert [place] if any blood in stool or dark black stool. Also discussed if ANY worsening or new symptoms- to call 911 immediately  Did EKG to make sure no arhythmia. Chest pain from a few weeks ago that has not recurred is very  low risk of being cardiac. We also discussed possibility of a fib as cause. If ever had recurrence then would certainly consider longer term cardiac monitoring  Orders Placed This Encounter  Procedures  . MR Brain W Wo Contrast    Standing Status:   Future    Standing Expiration Date:   10/12/2017    Order Specific Question:   If indicated for the ordered procedure, I authorize the administration of contrast media per Radiology protocol    Answer:   Yes    Order Specific Question:   Reason for Exam (SYMPTOM  OR DIAGNOSIS REQUIRED)    Answer:   left lip numbness- concern cva    Order Specific Question:   What is the patient's sedation requirement?    Answer:   No Sedation    Order Specific Question:   Does the patient have a pacemaker or implanted devices?    Answer:   Yes    Order Specific Question:   Manufacturer of pacemake or implanted device?    Answer:   fusion - rod in neck    Order Specific Question:   Preferred imaging location?    Answer:   [facility] (table limit-550lbs)    Order Specific Question:   Radiology Contrast Protocol will attach to exam    Answer:   [email or web address]  . EKG 12-Lead    Order Specific Question:   Where should this test be performed    Answer:   Other  . ECHOCARDIOGRAM COMPLETE    Standing Status:   Future    Standing Expiration Date:   11/12/2017    Scheduling Instructions:     Concern CVA- left lip numbness    Order Specific Question:   Where should this test be performed    Answer:   [facility])    Order Specific Question:   Does the patient weigh less than or greater than 250 lbs?    Answer:   Patient weighs less than 250 lbs    Order Specific Question:   Complete or Limited study?    Answer:   Complete    Order Specific Question:   With Image Enhancing Agent or without Image Enhancing Agent?    Answer:   With Image Enhancing Agent    Order Specific Question:   Reason for exam-Echo     Answer:   Other - See Comments Section    Meds ordered this encounter  Medications  . Probiotic Product (PROBIOTIC DAILY PO)    Sig: Take 1 tablet by mouth daily.  . clopidogrel (PLAVIX) 75 MG tablet    Sig: Take 1 tablet (75 mg total) by mouth daily.    Dispense:  30 tablet    Refill:  3  . atorvastatin (LIPITOR) 40 MG tablet    Sig: Take 1 tablet (40 mg total) by mouth daily.  Dispense:  30 tablet    Refill:  3   The duration of face-to-face time during this visit was greater than 40 minutes. Greater than 50% of this time was spent in counseling, explanation of diagnosis, planning of further management, and/or coordination of care including discussion or benefits and risks of above plan as extensive workup needed and high risk medication used- plavix.    Return precautions advised.  Garret Reddish, MD

## 2016-08-17 ENCOUNTER — Ambulatory Visit
Admission: RE | Admit: 2016-08-17 | Discharge: 2016-08-17 | Disposition: A | Discharge status: Home / Self Care | Payer: Managed Care, Other (non HMO) | Source: Ambulatory Visit | Attending: Family Medicine | Admitting: Family Medicine

## 2016-08-17 DIAGNOSIS — R200 Anesthesia of skin: Secondary | ICD-10-CM

## 2016-08-17 MED ORDER — GADOBENATE DIMEGLUMINE 529 MG/ML IV SOLN
13.0000 mL | Freq: Once | INTRAVENOUS | Status: AC | PRN
  Administered 2016-08-17: 13 mL via INTRAVENOUS

## 2016-08-31 ENCOUNTER — Other Ambulatory Visit: Payer: Self-pay

## 2016-08-31 ENCOUNTER — Ambulatory Visit (HOSPITAL_COMMUNITY): Payer: Managed Care, Other (non HMO) | Attending: Cardiovascular Disease

## 2016-08-31 DIAGNOSIS — R200 Anesthesia of skin: Secondary | ICD-10-CM | POA: Diagnosis not present

## 2016-09-01 ENCOUNTER — Ambulatory Visit (HOSPITAL_COMMUNITY)
Admission: RE | Admit: 2016-09-01 | Discharge: 2016-09-01 | Disposition: A | Discharge status: Home / Self Care | Payer: Managed Care, Other (non HMO) | Source: Ambulatory Visit | Attending: Cardiovascular Disease | Admitting: Cardiovascular Disease

## 2016-09-01 DIAGNOSIS — I6523 Occlusion and stenosis of bilateral carotid arteries: Secondary | ICD-10-CM | POA: Insufficient documentation

## 2016-09-01 DIAGNOSIS — R200 Anesthesia of skin: Secondary | ICD-10-CM

## 2016-09-09 ENCOUNTER — Ambulatory Visit: Payer: Managed Care, Other (non HMO) | Admitting: Family Medicine

## 2016-09-28 ENCOUNTER — Telehealth: Payer: Self-pay | Admitting: Family Medicine

## 2016-09-28 NOTE — Telephone Encounter (Signed)
Pt would like to see if you all had or have gotten approval from the insurance company for the following test due to her receiving back a denial from the insurance for the MRI and Echocardiogram and not sure have not received anything from the insurance about the Cardiac Artery but want to make sure that it has been approved.  After she hears back from you she will go ahead and schedule a consultation appointment with Dr. Yong Channel.

## 2017-03-02 ENCOUNTER — Other Ambulatory Visit: Payer: Self-pay | Admitting: Family Medicine

## 2017-03-02 DIAGNOSIS — Z1239 Encounter for other screening for malignant neoplasm of breast: Secondary | ICD-10-CM

## 2017-03-15 ENCOUNTER — Ambulatory Visit
Admission: RE | Admit: 2017-03-15 | Discharge: 2017-03-15 | Disposition: A | Discharge status: Home / Self Care | Payer: Managed Care, Other (non HMO) | Source: Ambulatory Visit | Attending: Family Medicine | Admitting: Family Medicine

## 2017-03-15 DIAGNOSIS — Z1239 Encounter for other screening for malignant neoplasm of breast: Secondary | ICD-10-CM

## 2018-04-03 ENCOUNTER — Other Ambulatory Visit: Payer: Self-pay | Admitting: Family Medicine

## 2018-04-03 DIAGNOSIS — Z1231 Encounter for screening mammogram for malignant neoplasm of breast: Secondary | ICD-10-CM

## 2018-05-16 ENCOUNTER — Ambulatory Visit
Admission: RE | Admit: 2018-05-16 | Discharge: 2018-05-16 | Disposition: A | Discharge status: Home / Self Care | Payer: Managed Care, Other (non HMO) | Source: Ambulatory Visit | Attending: Family Medicine | Admitting: Family Medicine

## 2018-05-16 DIAGNOSIS — Z1231 Encounter for screening mammogram for malignant neoplasm of breast: Secondary | ICD-10-CM

## 2018-10-22 ENCOUNTER — Telehealth: Payer: Self-pay | Admitting: *Deleted

## 2018-10-22 NOTE — Telephone Encounter (Signed)
Spoke with patient, explained that it is time for her recall colonoscopy with Dr.Beavers, hx colon polyps. Patient denies being on Plavix, she states she took herself off this. Patient states she wants to see her PCP for a follow up before she has the colonoscopy so she will call us back when she is ready to schedule the colonoscopy. Explained that she will need an office visit if she is on any blood thinners. Pt verbalizes understanding.

## 2018-11-19 ENCOUNTER — Encounter: Payer: Self-pay | Admitting: Gastroenterology

## 2019-09-12 ENCOUNTER — Telehealth: Payer: Self-pay | Admitting: Family Medicine

## 2019-09-12 NOTE — Telephone Encounter (Signed)
Nurse Assessment Nurse: Windle Guard, RN, Olin Hauser Date/Time (Eastern Time): 09/12/2019 10:53:49 AM Confirm and document reason for call. If symptomatic, describe symptoms. ---Caller states she has abdominal pain ( cramping ) which has been off and on for several weeks. Has the patient had close contact with a person known or suspected to have the novel coronavirus illness OR traveled / lives in area with major community spread (including international travel) in the last 14 days from the onset of symptoms? * If Asymptomatic, screen for exposure and travel within the last 14 days. ---Yes Does the patient have any new or worsening symptoms? ---Yes Will a triage be completed? ---Yes Related visit to physician within the last 2 weeks? ---No Does the PT have any chronic conditions? (i.e. diabetes, asthma, this includes High risk factors for pregnancy, etc.) ---No Is this a behavioral health or substance abuse call? ---No Guidelines Guideline Title Affirmed Question Affirmed Notes Nurse Date/Time (Eastern Time) Abdominal Pain - Female [1] MODERATE pain (e.g., interferes with normal activities) AND [2] pain comes and goes (cramps) AND [3] present > 24 hours (Exception: pain with Vomiting Windle Guard, RN, Olin Hauser 09/12/2019 10:55:35 AMPLEASE NOTE: All timestamps contained within this report are represented as [place] Standard Time. CONFIDENTIALTY NOTICE: This fax transmission is intended only for the addressee. It contains information that is legally privileged, confidential or otherwise protected from use or disclosure. If you are not the intended recipient, you are strictly prohibited from reviewing, disclosing, copying using or disseminating any of this information or taking any action in reliance on or regarding this information. If you have received this fax in error, please notify us immediately by telephone so that we can arrange for its return to [place]. Phone: [phone], Toll-Free: [phone], Fax:  [phone] Page: 2 of 2 Call Id: [id number] Guidelines Guideline Title Affirmed Question Affirmed Notes Nurse Date/Time Eilene Ghazi Time) or Diarrhea - see that Guideline) Disp. Time Eilene Ghazi Time) Disposition Final User 09/12/2019 10:49:32 AM Send to Urgent Marlyce Huge 09/12/2019 10:57:57 AM See PCP within 24 Hours Yes Conner, RN, Otho Najjar Disagree/Comply Comply Caller Understands Yes PreDisposition Call Doctor Care Advice Given Per Guideline SEE PCP WITHIN 24 HOURS: * IF OFFICE WILL BE OPEN: You need to be seen within the next 24 hours. Call your doctor (or NP/PA) when the office opens and make an appointment. CRAMPS: * During cramps, drink some water, then lie down and try to find a comfortable position. DIET: * Drink adequate fluids. Eat a bland diet. * Avoid alcohol or caffeinated beverages * Avoid greasy or fatty foods. OTC MEDS - BISMUTH SUBSALICYLATE (E.G., KAOPECTATE, PEPTO-BISMOL): * Helps reduce abdominal cramping, diarrhea, and vomiting. * Adult dosage: two tablets or two tablespoons (30 ml) PO. Maximum of 8 doses in a 24 hour period. * Do not use for more than 2 days. CAUTION - BISMUTH SUBSALICYLATE (E.G., KAOPECTATE, PEPTO-BISMOL): * May cause a temporary darkening of stool and tongue. * Do not use if allergic to aspirin. - Do not use in pregnancy. * Read and follow the package instructions carefully. CALL BACK IF: * Severe pain lasts over 1 hour * Constant pain lasts over 2 hours * You become worse. CARE ADVICE given per Abdominal Pain, Female (Adult) guideline.

## 2019-09-12 NOTE — Telephone Encounter (Signed)
Noted  

## 2019-09-13 ENCOUNTER — Ambulatory Visit (INDEPENDENT_AMBULATORY_CARE_PROVIDER_SITE_OTHER): Payer: Managed Care, Other (non HMO) | Admitting: Family Medicine

## 2019-09-13 ENCOUNTER — Other Ambulatory Visit: Payer: Self-pay

## 2019-09-13 ENCOUNTER — Encounter: Payer: Self-pay | Admitting: Family Medicine

## 2019-09-13 VITALS — BP 144/92 | HR 92 | Temp 98.2°F | Ht 65.0 in | Wt 141.2 lb

## 2019-09-13 DIAGNOSIS — Z1211 Encounter for screening for malignant neoplasm of colon: Secondary | ICD-10-CM

## 2019-09-13 DIAGNOSIS — R1032 Left lower quadrant pain: Secondary | ICD-10-CM | POA: Diagnosis not present

## 2019-09-13 DIAGNOSIS — R1031 Right lower quadrant pain: Secondary | ICD-10-CM | POA: Diagnosis not present

## 2019-09-13 DIAGNOSIS — E785 Hyperlipidemia, unspecified: Secondary | ICD-10-CM | POA: Diagnosis not present

## 2019-09-13 LAB — POC URINALSYSI DIPSTICK (AUTOMATED)
Bilirubin, UA: POSITIVE
Blood, UA: NEGATIVE
Glucose, UA: NEGATIVE
Ketones, UA: NEGATIVE
Nitrite, UA: NEGATIVE
Protein, UA: NEGATIVE
Spec Grav, UA: >=1.03 — AB (ref 1.010–1.025)
Urobilinogen, UA: 0.2 E.U./dL
pH, UA: 5.5 (ref 5.0–8.0)

## 2019-09-13 NOTE — Progress Notes (Addendum)
Phone [phone] In person visit   Subjective:   Brittney Burke is a 59 y.o. year old very pleasant female patient who presents for/with See problem oriented charting. Also reestablish care with last visit >3 years ago Chief Complaint  Patient presents with  . Follow-up  . Abdominal Pain    This visit occurred during the SARS-CoV-2 public health emergency.  Safety protocols were in place, including screening questions prior to the visit, additional usage of staff PPE, and extensive cleaning of exam room while observing appropriate contact time as indicated for disinfecting solutions.   Past Medical History-  Patient Active Problem List   Diagnosis Date Noted  . Hyperlipidemia, unspecified 09/13/2019    Priority: Medium  . Hyperglycemia 07/01/2015    Priority: Medium  . History of melanoma 01/05/2007    Priority: Medium  . Allergic rhinitis 07/01/2015    Priority: Low  . Vitamin D deficiency 07/01/2015    Priority: Low  . GERD (gastroesophageal reflux disease) 07/01/2015    Priority: Low  . Arthritis of left ankle 07/01/2015    Priority: Low  . History of adenomatous polyp of colon 07/01/2015    Priority: Low  . VERTIGO 04/01/2010    Priority: Low    Medications- reviewed and updated Current Outpatient Medications  Medication Sig Dispense Refill  . atorvastatin (LIPITOR) 40 MG tablet Take 1 tablet (40 mg total) by mouth daily. 30 tablet 3  . cetirizine (ZYRTEC) 10 MG tablet Take 10 mg by mouth as needed.      . Cholecalciferol (VITAMIN D3) 1000 UNITS CAPS Take by mouth daily.     . clopidogrel (PLAVIX) 75 MG tablet Take 1 tablet (75 mg total) by mouth daily. 30 tablet 3  . Probiotic Product (PROBIOTIC DAILY PO) Take 1 tablet by mouth daily.    . ranitidine (ZANTAC) 150 MG tablet Take 150 mg by mouth as needed.       No current facility-administered medications for this visit.     Objective:  BP (!) 144/92   Pulse 92   Temp 98.2 F (36.8 C)   Ht 5\' 5"  (1.651  m)   Wt 141 lb 3.2 oz (64 kg)   SpO2 98%   BMI 23.50 kg/m  Gen: NAD, resting comfortably CV: RRR no murmurs rubs or gallops Lungs: CTAB no crackles, wheeze, rhonchi Abdomen: soft/mild tenderness in lower abdomen with palpation-particularly suprapubic and left lower quadrant/nondistended/normal bowel sounds. No rebound or guarding.  Ext: no edema Skin: warm, dry    Assessment and Plan  Abdominal Pain/Cramping S: 2-3 weeks ago started with lower abd pain/cramping that remind her of menstrual cramps and c/o constipation and gas.  At baseline she has 2 Bms per day and recently has had some missed days and felt bloated. Slightly better today- hard normal 2 BMs- less gassy when has more BMs. Also with BM had less abdominal cramping.   Pain at its worst 4-5. At the moment is 1/10. Tylenol helps slightly. Doesn't feel it when wakes up- around time of first BM starts and then stays through day and comes in waves.   Last colonoscopy 10/14/13 with plan 5 year repeat so she is overdue.  No vaginal discharge was reported.  No burning with peeing or frequent urination (other than she drinks a lot of water and no recent increase). No fever, chills, unintentional weight loss, night sweats.   Surgical history- Still has her appendix. Has had diverticulosis on prior colonoscopy.  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 Doesn't do the best job with veggies A/P: 59 year old female with lower abdominal cramping for several weeks. This may be constipation related- she is going to increase fluids- I want her to try a capful of miralax daily until she is back to her normal routine of twice a day BMs. If she has loose stool she can hold for a day and then do half capful daily- once again can stop this if has loose stools. After this trial of miralax if symptoms are improved- may be worth trying metamucil or increasing veggies which is hard for her.    We  also will refer for updated colonoscopy and she has a visit with gynecology next week- they may consider transvaginal ultrasound which would be lower radiation than CT scan- but if that workup is unrevealing and her symptoms persist despite the above- then we would need to consider abdominal CT- she will give me an update in approximately a month regardless   She also is concerned due to prior Zantac use-this makes her worry about potential malignancy-once again we discussed CT scan if not improving or no obvious cause of symptoms noted  Hyperlipidemia, unspecified  #Lip numbness from 2018 -we also reviewed findings from last office visit in March 2018.  Patient's lip numbness resolved on its own-unclear etiology.  MRI without stroke.  Echocardiogram reassuring.  Carotid ultrasound 1 to 39% stenosis bilaterally.  Initially I recommended she stop aspirin and start Plavix-she ended up not taking Plavix and Never restarted aspirin.  She has not been taking statin previously prescribed -Of note patient did have mildly elevated blood pressure today-we will recheck at her physical in August -We agreed to update lipids at her physical-she had already eaten a lot today and not sure how that would affect her levels-likely would target LDL at least under 100 though under 70 would be more ideal with carotid narrowing   Recommended follow up: Keep scheduled physical in August Future Appointments  Date Time Provider [place]  01/27/2020  4:00 PM Marin Olp, MD [facility]    Lab/Order associations:     ICD-10-CM   1. Bilateral lower abdominal cramping  R10.31 POCT Urinalysis Dipstick (Automated)   R10.32 CBC with Differential/Platelet    Comprehensive metabolic panel    CANCELED: CBC with Differential/Platelet    CANCELED: Comprehensive metabolic panel  2. Hyperlipidemia, unspecified hyperlipidemia type  E78.5   3. Screen for colon cancer  Z12.11 Ambulatory referral to Gastroenterology     Return precautions advised.  Garret Reddish, MD

## 2019-09-13 NOTE — Assessment & Plan Note (Signed)
#  Lip numbness from 2018 -we also reviewed findings from last office visit in March 2018.  Patient's lip numbness resolved on its own-unclear etiology.  MRI without stroke.  Echocardiogram reassuring.  Carotid ultrasound 1 to 39% stenosis bilaterally.  Initially I recommended she stop aspirin and start Plavix-she ended up not taking Plavix and Never restarted aspirin.  She has not been taking statin previously prescribed -Of note patient did have mildly elevated blood pressure today-we will recheck at her physical in August -We agreed to update lipids at her physical-she had already eaten a lot today and not sure how that would affect her levels-likely would target LDL at least under 100 though under 70 would be more ideal with carotid narrowing

## 2019-09-13 NOTE — Patient Instructions (Addendum)
Health Maintenance Due  Topic Date Due  . PAP SMEAR-Modifier  Has one scheduled- please have them send [place] a copy 06/06/2016  . COLONOSCOPY - We will call you within two weeks about your referral to GI. If you do not hear within 3 weeks, give [place] a call.   10/15/2018  . MAMMOGRAM -call breast center and get this updated- it will automatically come to [place] 05/17/2019   59 year old female with lower abdominal cramping for several weeks. This may be constipation related- she is going to increase fluids- I want her to try a capful of miralax daily until she is back to her normal routine of twice a day BMs. If she has loose stool she can hold for a day and then do half capful daily- once again can stop this if has loose stools. After this trial of miralax if symptoms are improved- may be worth trying metamucil or increasing veggies which is hard for her.   We also will refer for updated colonoscopy and she has a visit with gynecology next week- they may consider transvaginal ultrasound which would be lower radiation than CT scan- but if that workup is unrevealing and her symptoms persist despite the above- then we would need to consider abdominal CT- she will give me an update in approximately a month regardless   Please stop by lab before you go If you do not have mychart- we will call you about results within 5 business days of [place] receiving them.  If you have mychart- we will send your results within 3 business days of [place] receiving them.  If abnormal or we want to clarify a result, we will call or mychart you to make sure you receive the message.  If you have questions or concerns or don't hear within 5 business days, please send [place] a message or call us.

## 2019-09-14 LAB — CBC WITH DIFFERENTIAL/PLATELET
Absolute Monocytes: 498 cells/uL (ref 200–950)
Basophils Absolute: 72 cells/uL (ref 0–200)
Basophils Relative: 1.2 %
Eosinophils Absolute: 438 cells/uL (ref 15–500)
Eosinophils Relative: 7.3 %
HCT: 45.6 % — ABNORMAL HIGH (ref 35.0–45.0)
Hemoglobin: 15.5 g/dL (ref 11.7–15.5)
Lymphs Abs: 1812 cells/uL (ref 850–3900)
MCH: 33.8 pg — ABNORMAL HIGH (ref 27.0–33.0)
MCHC: 34 g/dL (ref 32.0–36.0)
MCV: 99.3 fL (ref 80.0–100.0)
MPV: 11 fL (ref 7.5–12.5)
Monocytes Relative: 8.3 %
Neutro Abs: 3180 cells/uL (ref 1500–7800)
Neutrophils Relative %: 53 %
Platelets: 239 10*3/uL (ref 140–400)
RBC: 4.59 10*6/uL (ref 3.80–5.10)
RDW: 11.5 % (ref 11.0–15.0)
Total Lymphocyte: 30.2 %
WBC: 6 10*3/uL (ref 3.8–10.8)

## 2019-09-14 LAB — COMPREHENSIVE METABOLIC PANEL
AG Ratio: 1.7 (calc) (ref 1.0–2.5)
ALT: 31 U/L — ABNORMAL HIGH (ref 6–29)
AST: 28 U/L (ref 10–35)
Albumin: 4.3 g/dL (ref 3.6–5.1)
Alkaline phosphatase (APISO): 78 U/L (ref 37–153)
BUN: 18 mg/dL (ref 7–25)
CO2: 28 mmol/L (ref 20–32)
Calcium: 10 mg/dL (ref 8.6–10.4)
Chloride: 102 mmol/L (ref 98–110)
Creat: 0.71 mg/dL (ref 0.50–0.99)
Globulin: 2.5 g/dL (calc) (ref 1.9–3.7)
Glucose, Bld: 112 mg/dL — ABNORMAL HIGH (ref 65–99)
Potassium: 4.3 mmol/L (ref 3.5–5.3)
Sodium: 140 mmol/L (ref 135–146)
Total Bilirubin: 0.3 mg/dL (ref 0.2–1.2)
Total Protein: 6.8 g/dL (ref 6.1–8.1)

## 2019-09-16 ENCOUNTER — Other Ambulatory Visit: Payer: Self-pay

## 2019-09-16 DIAGNOSIS — R1031 Right lower quadrant pain: Secondary | ICD-10-CM

## 2019-09-20 ENCOUNTER — Other Ambulatory Visit (INDEPENDENT_AMBULATORY_CARE_PROVIDER_SITE_OTHER): Payer: Managed Care, Other (non HMO)

## 2019-09-20 ENCOUNTER — Other Ambulatory Visit: Payer: Self-pay

## 2019-09-20 DIAGNOSIS — R1032 Left lower quadrant pain: Secondary | ICD-10-CM | POA: Diagnosis not present

## 2019-09-20 DIAGNOSIS — R1031 Right lower quadrant pain: Secondary | ICD-10-CM

## 2019-09-20 LAB — URINALYSIS, MICROSCOPIC ONLY: RBC / HPF: NONE SEEN (ref 0–?)

## 2019-09-21 LAB — URINE CULTURE
MICRO NUMBER:: 10373121
SPECIMEN QUALITY:: ADEQUATE

## 2019-09-25 ENCOUNTER — Other Ambulatory Visit: Payer: Self-pay | Admitting: Obstetrics and Gynecology

## 2019-09-25 DIAGNOSIS — M8580 Other specified disorders of bone density and structure, unspecified site: Secondary | ICD-10-CM

## 2019-10-01 ENCOUNTER — Other Ambulatory Visit: Payer: Self-pay | Admitting: Obstetrics and Gynecology

## 2019-10-01 DIAGNOSIS — Z1231 Encounter for screening mammogram for malignant neoplasm of breast: Secondary | ICD-10-CM

## 2019-11-06 ENCOUNTER — Ambulatory Visit
Admission: RE | Admit: 2019-11-06 | Discharge: 2019-11-06 | Disposition: A | Discharge status: Home / Self Care | Payer: Managed Care, Other (non HMO) | Source: Ambulatory Visit | Attending: Obstetrics and Gynecology | Admitting: Obstetrics and Gynecology

## 2019-11-06 ENCOUNTER — Other Ambulatory Visit: Payer: Self-pay

## 2019-11-06 DIAGNOSIS — M8580 Other specified disorders of bone density and structure, unspecified site: Secondary | ICD-10-CM

## 2019-11-06 DIAGNOSIS — Z1231 Encounter for screening mammogram for malignant neoplasm of breast: Secondary | ICD-10-CM

## 2019-11-14 ENCOUNTER — Encounter: Payer: Self-pay | Admitting: Family Medicine

## 2020-01-20 NOTE — Patient Instructions (Addendum)
Thanks for doing labs If you have mychart- we will send your results within 3 business days of [place] receiving them.  If you do not have mychart- we will call you about results within 5 business days of [place] receiving them.  *please note we are currently using [facility] which has a longer processing time than [facility] typically so labs may not come back as quickly as in the past *please also note that you will see labs on mychart as soon as they post. I will later go in and write notes on them- will say "notes from Dr. Yong Channel"  When things settle down with covid please get your colonoscopy updated

## 2020-01-20 NOTE — Progress Notes (Signed)
Phone [phone]   Subjective:  Patient presents today for their annual physical. Chief complaint-noted.   See problem oriented charting- Review of Systems  Constitutional: Negative for chills and fever.  HENT: Negative for congestion and hearing loss.   Eyes: Negative for blurred vision and double vision.  Respiratory: Negative for cough and shortness of breath.   Cardiovascular: Negative for chest pain and palpitations.  Gastrointestinal: Positive for abdominal pain (resolved from earlier this year). Negative for constipation, diarrhea, nausea and vomiting.  Genitourinary: Negative for dysuria and frequency.  Musculoskeletal: Negative for joint pain and myalgias.  Skin: Negative for itching and rash.  Neurological: Negative for dizziness and headaches.  Endo/Heme/Allergies: Negative for polydipsia. Does not bruise/bleed easily.  Psychiatric/Behavioral: Negative for depression, substance abuse and suicidal ideas.   The following were reviewed and entered/updated in epic: Past Medical History:  Diagnosis Date  . Allergy   . GERD (gastroesophageal reflux disease)   . Kidney stone   . SKIN CANCER, HX OF 01/05/2007  . VERTIGO 04/01/2010   Patient Active Problem List   Diagnosis Date Noted  . Osteopenia of lumbar spine 01/27/2020    Priority: Medium  . Hyperlipidemia, unspecified 09/13/2019    Priority: Medium  . Hyperglycemia 07/01/2015    Priority: Medium  . History of melanoma 01/05/2007    Priority: Medium  . Allergic rhinitis 07/01/2015    Priority: Low  . Vitamin D deficiency 07/01/2015    Priority: Low  . GERD (gastroesophageal reflux disease) 07/01/2015    Priority: Low  . Arthritis of left ankle 07/01/2015    Priority: Low  . History of adenomatous polyp of colon 07/01/2015    Priority: Low  . VERTIGO 04/01/2010    Priority: Low  . Chronic pansinusitis 07/04/2016   Past Surgical History:  Procedure Laterality Date  . BREAST CYST EXCISION  age 66   benign   . CERVICAL DISC SURGERY    . DILATION AND CURETTAGE OF UTERUS  1998    Family History  Problem Relation Age of Onset  . Breast cancer Mother   . Diabetes Mother   . Hypertension Mother   . Diabetes Father        mother  . Heart attack Father 69  . Transient ischemic attack Father 33  . Bladder Cancer Father   . Hypertension Father   . Colon cancer Paternal Grandfather 61  . Leukemia Brother 50       CLL  . Breast cancer Maternal Aunt   . Breast cancer Cousin   . Uterine cancer Paternal Grandmother     Medications- reviewed and updated Current Outpatient Medications  Medication Sig Dispense Refill  . cetirizine (ZYRTEC) 10 MG tablet Take 10 mg by mouth as needed.      . Cholecalciferol (VITAMIN D3) 1000 UNITS CAPS Take by mouth daily.      No current facility-administered medications for this visit.    Allergies-reviewed and updated No Known Allergies  Social History   Social History Narrative   Married. No children.    Lost step mom during pandemic. 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   Enjoys work but very stressful- a lot of travel      [place], cooking, yard work.    Yoga 2x a week up to 5 times a week.    Objective  Objective:  BP 138/80   Pulse 83  Temp (!) 97.3 F (36.3 C) (Temporal)   Ht 5\' 5"  (1.651 m)   Wt 144 lb 3.2 oz (65.4 kg)   SpO2 98%   BMI 24.00 kg/m  Gen: NAD, resting comfortably HEENT: Mucous membranes are moist. Oropharynx normal Neck: no thyromegaly CV: RRR no murmurs rubs or gallops Lungs: CTAB no crackles, wheeze, rhonchi Abdomen: soft/nontender/nondistended/normal bowel sounds. No rebound or guarding.  Ext: no edema Skin: warm, dry Neuro: grossly normal, moves all extremities, PERRLA   Assessment and Plan   59 y.o. female presenting for annual physical.  Health Maintenance counseling: 1. Anticipatory guidance: Patient counseled regarding regular  dental exams q3 months, eye exams - Dr. Arlyce Dice yearly- saw him before retired,  avoiding smoking and second hand smoke , limiting alcohol to 1 beverage per day .   2. Risk factor reduction:  Advised patient of need for regular exercise and diet rich and fruits and vegetables to reduce risk of heart attack and stroke. Exercise- yoga classes over zoom- 3 days a week total. Diet-eating 3 meals a day and doesn't eat after dinner as long as possible and doesn't snack. .  Her goal has been 135 on home scales-significant prior weight loss-weight thankfully stable over the last few years Wt Readings from Last 3 Encounters:  01/27/20 144 lb 3.2 oz (65.4 kg)  09/13/19 141 lb 3.2 oz (64 kg)  08/12/16 144 lb 12.8 oz (65.7 kg)  3. Immunizations/screenings/ancillary studies-thanked patient for doing COVID-19 vaccination.  Recommended flu shot in the fall which she declines.Also discussed Shingrix - opts out for now Immunization History  Administered Date(s) Administered  . Moderna SARS-COVID-2 Vaccination 08/19/2019, 09/09/2019  . Td 08/27/2008  . Tdap 08/27/2008, 12/21/2010   4. Cervical cancer screening- September 17, 2019 with 3-year repeat planned-[facility]  5. Breast cancer screening-  breast exam with gynecology and mammogram November 06, 2019.  Mother with breast cancer in her 24s 6. Colon cancer screening - slightly overdue for 5-year repeat from May 2015. She may hold off until delta variant has calmed down 7. Skin cancer screening- Dr. Elvera Lennox a few years ago- recommended follow up with melanoma history. advised regular sunscreen use. Denies worrisome, changing, or new skin lesions.  8. Birth control/STD check- postmenopausal and monogamous 9. Osteoporosis screening at 49- osteopenia followed by GYN last done 11/06/19 -Never smoker  Status of chronic or acute concerns   # GERD S prevacid every other week A/P: reasonable control with just OTC sparing PPI- monitor only for now. Consider if  more regular use checking b12    #hyperlipidemia S: Medication: off completely Lab Results  Component Value Date   CHOL 229 (H) 07/10/2015   HDL 84.00 07/10/2015   LDLCALC 125 (H) 07/10/2015   LDLDIRECT 118.5 03/24/2010   TRIG 98.0 07/10/2015   CHOLHDL 3 07/10/2015   A/P: update lipid panel and calculate 10 year ascvd risk - consider coronary calcium scoring- she is interested if risk above 7.5%   #Vitamin D deficiency S: Medication: Vitamin D3 1000units Last vitamin D Lab Results  Component Value Date   VD25OH 36.61 07/10/2015  A/P: hopefully controlled- update vitamin D  # hyperglycemia- will update a1c with labs suspect remains controlled after prior weight loss. Tries to be cautious on sweets and starches Lab Results  Component Value Date   HGBA1C 5.5 07/10/2015   # sinuses better lately- wonders if mask helping   Recommended follow up: Return in about 1 year (around 01/26/2021) for physical or sooner  if needed.  Lab/Order associations: not fasting   ICD-10-CM   1. Preventative health care  Z00.00 CBC with Differential/Platelet    Lipid panel    Ambulatory referral to Gastroenterology    COMPLETE METABOLIC PANEL WITH GFR    Hemoglobin A1c    VITAMIN D 25 Hydroxy (Vit-D Deficiency, Fractures)    Hemoglobin A1c    VITAMIN D 25 Hydroxy (Vit-D Deficiency, Fractures)    COMPLETE METABOLIC PANEL WITH GFR    Lipid panel    CBC with Differential/Platelet    CANCELED: Hemoglobin A1c    CANCELED: VITAMIN D 25 Hydroxy (Vit-D Deficiency, Fractures)  2. Gastroesophageal reflux disease without esophagitis  K21.9   3. Hyperlipidemia, unspecified hyperlipidemia type  E78.5 CBC with Differential/Platelet    Lipid panel    COMPLETE METABOLIC PANEL WITH GFR    COMPLETE METABOLIC PANEL WITH GFR    Lipid panel    CBC with Differential/Platelet  4. Vitamin D deficiency  E55.9 VITAMIN D 25 Hydroxy (Vit-D Deficiency, Fractures)    VITAMIN D 25 Hydroxy (Vit-D Deficiency, Fractures)      CANCELED: VITAMIN D 25 Hydroxy (Vit-D Deficiency, Fractures)  5. Encounter for screening colonoscopy  Z12.11 Ambulatory referral to Gastroenterology  6. Hyperglycemia  R73.9 Hemoglobin A1c    Hemoglobin A1c    CANCELED: Hemoglobin A1c  7. Osteopenia of lumbar spine  M85.88     No orders of the defined types were placed in this encounter.   Return precautions advised.  Garret Reddish, MD

## 2020-01-27 ENCOUNTER — Other Ambulatory Visit: Payer: Self-pay

## 2020-01-27 ENCOUNTER — Ambulatory Visit (INDEPENDENT_AMBULATORY_CARE_PROVIDER_SITE_OTHER): Payer: Managed Care, Other (non HMO) | Admitting: Family Medicine

## 2020-01-27 ENCOUNTER — Encounter: Payer: Self-pay | Admitting: Family Medicine

## 2020-01-27 VITALS — BP 138/80 | HR 83 | Temp 97.3°F | Ht 65.0 in | Wt 144.2 lb

## 2020-01-27 DIAGNOSIS — K219 Gastro-esophageal reflux disease without esophagitis: Secondary | ICD-10-CM

## 2020-01-27 DIAGNOSIS — Z0000 Encounter for general adult medical examination without abnormal findings: Secondary | ICD-10-CM

## 2020-01-27 DIAGNOSIS — R739 Hyperglycemia, unspecified: Secondary | ICD-10-CM

## 2020-01-27 DIAGNOSIS — E559 Vitamin D deficiency, unspecified: Secondary | ICD-10-CM

## 2020-01-27 DIAGNOSIS — M8588 Other specified disorders of bone density and structure, other site: Secondary | ICD-10-CM

## 2020-01-27 DIAGNOSIS — Z1211 Encounter for screening for malignant neoplasm of colon: Secondary | ICD-10-CM

## 2020-01-27 DIAGNOSIS — E785 Hyperlipidemia, unspecified: Secondary | ICD-10-CM | POA: Diagnosis not present

## 2020-01-28 LAB — COMPLETE METABOLIC PANEL WITH GFR
AG Ratio: 1.5 (calc) (ref 1.0–2.5)
ALT: 37 U/L — ABNORMAL HIGH (ref 6–29)
AST: 28 U/L (ref 10–35)
Albumin: 4.1 g/dL (ref 3.6–5.1)
Alkaline phosphatase (APISO): 79 U/L (ref 37–153)
BUN: 11 mg/dL (ref 7–25)
CO2: 28 mmol/L (ref 20–32)
Calcium: 9.2 mg/dL (ref 8.6–10.4)
Chloride: 101 mmol/L (ref 98–110)
Creat: 0.61 mg/dL (ref 0.50–0.99)
GFR, Est African American: 114 mL/min/{1.73_m2} (ref >=60)
GFR, Est Non African American: 99 mL/min/{1.73_m2} (ref >=60)
Globulin: 2.8 g/dL (calc) (ref 1.9–3.7)
Glucose, Bld: 89 mg/dL (ref 65–99)
Potassium: 4 mmol/L (ref 3.5–5.3)
Sodium: 138 mmol/L (ref 135–146)
Total Bilirubin: 0.5 mg/dL (ref 0.2–1.2)
Total Protein: 6.9 g/dL (ref 6.1–8.1)

## 2020-01-28 LAB — LIPID PANEL
Cholesterol: 251 mg/dL — ABNORMAL HIGH (ref <200)
HDL: 90 mg/dL (ref >=50)
LDL Cholesterol (Calc): 129 mg/dL (calc) — ABNORMAL HIGH
Non-HDL Cholesterol (Calc): 161 mg/dL (calc) — ABNORMAL HIGH (ref <130)
Total CHOL/HDL Ratio: 2.8 (calc) (ref <5.0)
Triglycerides: 186 mg/dL — ABNORMAL HIGH (ref <150)

## 2020-01-28 LAB — CBC WITH DIFFERENTIAL/PLATELET
Absolute Monocytes: 607 cells/uL (ref 200–950)
Basophils Absolute: 59 cells/uL (ref 0–200)
Basophils Relative: 0.8 %
Eosinophils Absolute: 163 cells/uL (ref 15–500)
Eosinophils Relative: 2.2 %
HCT: 44 % (ref 35.0–45.0)
Hemoglobin: 14.9 g/dL (ref 11.7–15.5)
Lymphs Abs: 2005 cells/uL (ref 850–3900)
MCH: 33.8 pg — ABNORMAL HIGH (ref 27.0–33.0)
MCHC: 33.9 g/dL (ref 32.0–36.0)
MCV: 99.8 fL (ref 80.0–100.0)
MPV: 10.8 fL (ref 7.5–12.5)
Monocytes Relative: 8.2 %
Neutro Abs: 4566 cells/uL (ref 1500–7800)
Neutrophils Relative %: 61.7 %
Platelets: 204 10*3/uL (ref 140–400)
RBC: 4.41 10*6/uL (ref 3.80–5.10)
RDW: 11.6 % (ref 11.0–15.0)
Total Lymphocyte: 27.1 %
WBC: 7.4 10*3/uL (ref 3.8–10.8)

## 2020-01-28 LAB — HEMOGLOBIN A1C
Hgb A1c MFr Bld: 5.5 % of total Hgb (ref <5.7)
Mean Plasma Glucose: 111 (calc)
eAG (mmol/L): 6.2 (calc)

## 2020-01-28 LAB — VITAMIN D 25 HYDROXY (VIT D DEFICIENCY, FRACTURES): Vit D, 25-Hydroxy: 31 ng/mL (ref 30–100)

## 2020-03-26 ENCOUNTER — Ambulatory Visit (INDEPENDENT_AMBULATORY_CARE_PROVIDER_SITE_OTHER): Payer: Managed Care, Other (non HMO) | Admitting: Otolaryngology

## 2020-03-26 ENCOUNTER — Other Ambulatory Visit: Payer: Self-pay

## 2020-03-26 DIAGNOSIS — H6123 Impacted cerumen, bilateral: Secondary | ICD-10-CM | POA: Diagnosis not present

## 2020-03-26 NOTE — Progress Notes (Signed)
HPI: Brittney Burke is a 59 y.o. female who presents for evaluation of wax buildup in her ears with decreased hearing right side worse than left.  She has this cleaned every few years by ENT.  She was seen by her medical doctor who recommended referral here.  Past Medical History:  Diagnosis Date  . Allergy   . GERD (gastroesophageal reflux disease)   . Kidney stone   . SKIN CANCER, HX OF 01/05/2007  . VERTIGO 04/01/2010   Past Surgical History:  Procedure Laterality Date  . BREAST CYST EXCISION  age 7   benign  . CERVICAL DISC SURGERY    . DILATION AND CURETTAGE OF UTERUS  1998   Social History   Socioeconomic History  . Marital status: Married    Spouse name: Not on file  . Number of children: Not on file  . Years of education: Not on file  . Highest education level: Not on file  Occupational History  . Not on file  Tobacco Use  . Smoking status: Never Smoker  . Smokeless tobacco: Never Used  Substance and Sexual Activity  . Alcohol use: Yes    Alcohol/week: 7.0 standard drinks    Types: 7 Standard drinks or equivalent per week    Comment: glass of wine each day  . Drug use: No  . Sexual activity: Not on file  Other Topics Concern  . Not on file  Social History Narrative   Married. No children.    Lost step mom during pandemic. 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   Enjoys work but very stressful- a lot of travel      [place], cooking, yard work.    Yoga 2x a week up to 5 times a week.    Social Determinants of Health   Financial Resource Strain:   . Difficulty of Paying Living Expenses: Not on file  Food Insecurity:   . Worried About Charity fundraiser in the Last Year: Not on file  . Ran Out of Food in the Last Year: Not on file  Transportation Needs:   . Lack of Transportation (Medical): Not on file  . Lack of Transportation (Non-Medical): Not on file  Physical Activity:   . Days  of Exercise per Week: Not on file  . Minutes of Exercise per Session: Not on file  Stress:   . Feeling of Stress : Not on file  Social Connections:   . Frequency of Communication with Friends and Family: Not on file  . Frequency of Social Gatherings with Friends and Family: Not on file  . Attends Religious Services: Not on file  . Active Member of Clubs or Organizations: Not on file  . Attends Archivist Meetings: Not on file  . Marital Status: Not on file   Family History  Problem Relation Age of Onset  . Breast cancer Mother   . Diabetes Mother   . Hypertension Mother   . Diabetes Father        mother  . Heart attack Father 74  . Transient ischemic attack Father 64  . Bladder Cancer Father   . Hypertension Father   . Colon cancer Paternal Grandfather 22  . Leukemia Brother 50       CLL  . Breast cancer Maternal Aunt   . Breast cancer Cousin   . Uterine cancer Paternal Grandmother  No Known Allergies Prior to Admission medications   Medication Sig Start Date End Date Taking? Authorizing Provider  cetirizine (ZYRTEC) 10 MG tablet Take 10 mg by mouth as needed.      [provider]  Cholecalciferol (VITAMIN D3) 1000 UNITS CAPS Take by mouth daily.     [provider]     Positive ROS: Otherwise negative  All other systems have been reviewed and were otherwise negative with the exception of those mentioned in the HPI and as above.  Physical Exam: Constitutional: Alert, well-appearing, no acute distress Ears: External ears without lesions or tenderness. Ear canals are both completely occluded with wax.  This was cleaned with forceps and suction.  TMs were clear bilaterally.  Right ear wax was slightly more embedded.. Nasal: External nose without lesions. Clear nasal passages Oral: Oropharynx clear. Neck: No palpable adenopathy or masses Respiratory: Breathing comfortably  Skin: No facial/neck lesions or rash noted.  Cerumen impaction  removal  Date/Time: 03/26/2020 4:39 PM Performed by: Rozetta Nunnery, MD Authorized by: Rozetta Nunnery, MD   Consent:    Consent obtained:  Verbal   Consent given by:  Patient   Risks discussed:  Pain and bleeding Procedure details:    Location:  L ear and R ear   Procedure type: curette, suction and forceps   Post-procedure details:    Inspection:  TM intact and canal normal   Hearing quality:  Improved   Patient tolerance of procedure:  Tolerated well, no immediate complications Comments:     TMs are clear bilaterally.    Assessment: Bilateral cerumen impactions  Plan: This was cleaned in the office. She will follow-up as needed.  Radene Journey, MD

## 2020-05-05 ENCOUNTER — Telehealth: Payer: Self-pay

## 2020-05-05 NOTE — Telephone Encounter (Signed)
Pt is requesting her dad be seen by Dr. Yong Channel for a viral infection he has been experiencing. Pt is wanting a second opinion on her father. He is not a pt here. We made pt aware that her father could not get in til May and pt is requesting he be seen Friday.

## 2020-05-05 NOTE — Telephone Encounter (Signed)
I would love to help but unfortunately we have extremely limited availability this week especially with just having the holiday weekend.  Can patient not see his current primary care physician?

## 2020-05-05 NOTE — Telephone Encounter (Signed)
Patient's father's name is Brittney Burke date of birth 05-20-1933.

## 2020-05-05 NOTE — Telephone Encounter (Signed)
Spoke with the patient's daughter and her father is going to follow up with his current pcp.

## 2020-05-05 NOTE — Telephone Encounter (Signed)
Waiting for the patient to provide me with her father's name and date of birth.

## 2020-11-09 ENCOUNTER — Other Ambulatory Visit: Payer: Self-pay | Admitting: Obstetrics and Gynecology

## 2020-11-09 DIAGNOSIS — Z1231 Encounter for screening mammogram for malignant neoplasm of breast: Secondary | ICD-10-CM

## 2020-11-12 ENCOUNTER — Other Ambulatory Visit: Payer: Self-pay

## 2020-11-12 ENCOUNTER — Ambulatory Visit
Admission: RE | Admit: 2020-11-12 | Discharge: 2020-11-12 | Disposition: A | Discharge status: Home / Self Care | Payer: Managed Care, Other (non HMO) | Source: Ambulatory Visit | Attending: Obstetrics and Gynecology | Admitting: Obstetrics and Gynecology

## 2020-11-12 DIAGNOSIS — Z1231 Encounter for screening mammogram for malignant neoplasm of breast: Secondary | ICD-10-CM

## 2021-11-18 ENCOUNTER — Ambulatory Visit: Payer: Managed Care, Other (non HMO) | Admitting: Physician Assistant

## 2021-11-18 ENCOUNTER — Encounter: Payer: Self-pay | Admitting: Physician Assistant

## 2021-11-18 VITALS — BP 198/120 | HR 102 | Temp 98.7°F | Ht 65.0 in | Wt 140.4 lb

## 2021-11-18 DIAGNOSIS — R030 Elevated blood-pressure reading, without diagnosis of hypertension: Secondary | ICD-10-CM | POA: Diagnosis not present

## 2021-11-18 DIAGNOSIS — J0110 Acute frontal sinusitis, unspecified: Secondary | ICD-10-CM

## 2021-11-18 LAB — COMPREHENSIVE METABOLIC PANEL
ALT: 45 U/L — ABNORMAL HIGH (ref 0–35)
AST: 41 U/L — ABNORMAL HIGH (ref 0–37)
Albumin: 4.4 g/dL (ref 3.5–5.2)
Alkaline Phosphatase: 81 U/L (ref 39–117)
BUN: 12 mg/dL (ref 6–23)
CO2: 30 mEq/L (ref 19–32)
Calcium: 9.8 mg/dL (ref 8.4–10.5)
Chloride: 98 mEq/L (ref 96–112)
Creatinine, Ser: 0.61 mg/dL (ref 0.40–1.20)
GFR: 95.76 mL/min (ref >60.00)
Glucose, Bld: 116 mg/dL — ABNORMAL HIGH (ref 70–99)
Potassium: 3.7 mEq/L (ref 3.5–5.1)
Sodium: 138 mEq/L (ref 135–145)
Total Bilirubin: 0.4 mg/dL (ref 0.2–1.2)
Total Protein: 7.5 g/dL (ref 6.0–8.3)

## 2021-11-18 LAB — TSH: TSH: 0.99 u[IU]/mL (ref 0.35–5.50)

## 2021-11-18 LAB — CBC WITH DIFFERENTIAL/PLATELET
Basophils Absolute: 0.1 10*3/uL (ref 0.0–0.1)
Basophils Relative: 1.9 % (ref 0.0–3.0)
Eosinophils Absolute: 0.2 10*3/uL (ref 0.0–0.7)
Eosinophils Relative: 2.8 % (ref 0.0–5.0)
HCT: 45.3 % (ref 36.0–46.0)
Hemoglobin: 15.4 g/dL — ABNORMAL HIGH (ref 12.0–15.0)
Lymphocytes Relative: 26.1 % (ref 12.0–46.0)
Lymphs Abs: 1.5 10*3/uL (ref 0.7–4.0)
MCHC: 34.1 g/dL (ref 30.0–36.0)
MCV: 102.1 fl — ABNORMAL HIGH (ref 78.0–100.0)
Monocytes Absolute: 0.7 10*3/uL (ref 0.1–1.0)
Monocytes Relative: 11.7 % (ref 3.0–12.0)
Neutro Abs: 3.3 10*3/uL (ref 1.4–7.7)
Neutrophils Relative %: 57.5 % (ref 43.0–77.0)
Platelets: 190 10*3/uL (ref 150.0–400.0)
RBC: 4.44 Mil/uL (ref 3.87–5.11)
RDW: 12.7 % (ref 11.5–15.5)
WBC: 5.7 10*3/uL (ref 4.0–10.5)

## 2021-11-18 LAB — POC COVID19 BINAXNOW: SARS Coronavirus 2 Ag: NEGATIVE

## 2021-11-18 MED ORDER — AMOXICILLIN-POT CLAVULANATE 875-125 MG PO TABS: 1.0000 | ORAL_TABLET | Freq: Two times a day (BID) | ORAL | 0 refills | Status: DC

## 2021-11-18 NOTE — Progress Notes (Addendum)
Brittney Burke is a 61 y.o. female here for a new problem of sinusitis.    History of Present Illness:   Chief Complaint  Patient presents with   Headache    Pt has been experiencing headache, sinus pressure, scratchy throat and red eyes.    HPI   Sinus Problem Patient complain of sinus pain/pressure that has been onset for 1 week.  Her associated symptoms include headaches, scratchy throat, nasal congestion, facial pain and red eyes. States she recently took trip to [place] about a week ago. She has had travel to several places for the past few weeks. Thinks this might be sinus infection since her symptoms are similar. States she has tried Nyquil, Facilities manager with no relief. She does have a hx of allergic rhinitis. She takes Zyrtec 10 mg daily an Flonase for her allergies. Reports she has taken several Covid home test which was all negative. She does admit that she has felt some heart racing couple of days ago. She had one episode of mild nose bleed this morning. No chest pain or shortness of breath. No reported fever or chills. No pain or swelling in leg. No ear pain or throat pain. No other know sick contacts. No n/v/d or wheezing.   Elevated blood pressure readings  Patient has had elevated blood pressure in the office today. She is not sure what could be causing this. Denies chest pain, LE swelling/calf pain or shortness of breath. Denies difficulty breathing. No reported blurred vision. She does admit to taking sudafed a few nights ago.    Past Medical History:  Diagnosis Date   Allergy    GERD (gastroesophageal reflux disease)    Kidney stone    SKIN CANCER, HX OF 01/05/2007   VERTIGO 04/01/2010     Social History   Tobacco Use   Smoking status: Never   Smokeless tobacco: Never  Substance Use Topics   Alcohol use: Yes    Alcohol/week: 7.0 standard drinks of alcohol    Types: 7 Standard drinks or equivalent per week    Comment: glass of wine each day   Drug  use: No    Past Surgical History:  Procedure Laterality Date   BREAST CYST EXCISION  age 13   benign   CERVICAL DISC SURGERY     DILATION AND CURETTAGE OF UTERUS  1998    Family History  Problem Relation Age of Onset   Breast cancer Mother    Diabetes Mother    Hypertension Mother    Diabetes Father        mother   Heart attack Father 74   Transient ischemic attack Father 67   Bladder Cancer Father    Hypertension Father    Colon cancer Paternal Grandfather 29   Leukemia Brother 44       CLL   Breast cancer Maternal Aunt    Breast cancer Cousin    Uterine cancer Paternal Grandmother     No Known Allergies  Current Medications:   Current Outpatient Medications:    cetirizine (ZYRTEC) 10 MG tablet, Take 10 mg by mouth as needed.  , Disp: , Rfl:    Cholecalciferol (VITAMIN D3) 1000 UNITS CAPS, Take by mouth daily. , Disp: , Rfl:    Review of Systems:   ROS Negative unless otherwise specified per HPI.   Vitals:   Vitals:   11/18/21 0855 11/18/21 0859  BP: (!) 204/120 (!) 212/122  Pulse: (!) 102   Temp: 98.7  F (37.1 C)   SpO2: 98%   Weight: 140 lb 6.4 oz (63.7 kg)   Height: '5\' 5"'$  (1.651 m)      Body mass index is 23.36 kg/m.  Physical Exam:   Physical Exam Vitals and nursing note reviewed.  Constitutional:      General: She is not in acute distress.    Appearance: She is well-developed. She is not ill-appearing or toxic-appearing.  Cardiovascular:     Rate and Rhythm: Normal rate and regular rhythm.     Pulses: Normal pulses.     Heart sounds: Normal heart sounds, S1 normal and S2 normal.  Pulmonary:     Effort: Pulmonary effort is normal.     Breath sounds: Normal breath sounds.  Skin:    General: Skin is warm and dry.  Neurological:     Mental Status: She is alert.     GCS: GCS eye subscore is 4. GCS verbal subscore is 5. GCS motor subscore is 6.  Psychiatric:        Speech: Speech normal.        Behavior: Behavior normal. Behavior is  cooperative.    Results for orders placed or performed in visit on 11/18/21  POC COVID-19  Result Value Ref Range   SARS Coronavirus 2 Ag Negative Negative    Assessment and Plan:   Acute non-recurrent frontal sinusitis COVID test negative Suspect sinus infection Start oral augmentin for her symptoms Avoid all decongestants Push fluids/rest Follow-up in 1 week (due to issue below) -- sooner if concerns  Elevated blood pressure reading EKG tracing is personally reviewed.  EKG notes NSR.  No acute changes.  No evidence/symptoms of end organ damage Reviewed patient with Dr. Garret Reddish Recommend rest, hydration and avoiding all decongestants Follow-up next week for BP office visit Update blood work today If any worsening/new symptoms --> she was advised to go to the ER Wells criteria 0  I,Savera Zaman,acting as a Education administrator for [employer], PA.,have documented all relevant documentation on the behalf of Brittney Coke, PA,as directed by  Brittney Coke, PA while in the presence of Brittney Burke, [place].   I, Brittney Burke, [place], have reviewed all documentation for this visit. The documentation on 11/18/21 for the exam, diagnosis, procedures, and orders are all accurate and complete.  Brittney Coke, PA-C

## 2021-11-18 NOTE — Patient Instructions (Addendum)
It was great to see you!  EKG looks okay  Please start augmentin for your sinus infection  Upper respiratory infection recommendations for those with current or history of elevated blood pressure: 1. Avoid all over-the-counter antihistamines except Claritin/Loratadine and Zyrtec/Cetrizine. 2. Avoid all combination including cold sinus allergies flu decongestant and sleep medications 3. You can use Robitussin DM Mucinex and Mucinex DM for cough.   Let's follow-up next week to recheck your blood pressure, sooner if you have concerns.  If you develop any chest pain, shortness of breath, severe headache or vision changes or any other concerns --> go to the ER.  Take care,  [name] PA-C

## 2021-11-22 ENCOUNTER — Other Ambulatory Visit: Payer: Self-pay | Admitting: Family Medicine

## 2021-11-22 DIAGNOSIS — Z1231 Encounter for screening mammogram for malignant neoplasm of breast: Secondary | ICD-10-CM

## 2021-11-24 ENCOUNTER — Ambulatory Visit
Admission: RE | Admit: 2021-11-24 | Discharge: 2021-11-24 | Disposition: A | Discharge status: Home / Self Care | Payer: Managed Care, Other (non HMO) | Source: Ambulatory Visit | Attending: Family Medicine | Admitting: Family Medicine

## 2021-11-24 DIAGNOSIS — Z1231 Encounter for screening mammogram for malignant neoplasm of breast: Secondary | ICD-10-CM

## 2021-11-25 ENCOUNTER — Ambulatory Visit: Payer: Managed Care, Other (non HMO) | Admitting: Physician Assistant

## 2021-11-25 ENCOUNTER — Encounter: Payer: Self-pay | Admitting: Physician Assistant

## 2021-11-25 VITALS — BP 160/90 | HR 90 | Temp 98.1°F | Ht 65.0 in | Wt 140.5 lb

## 2021-11-25 DIAGNOSIS — R030 Elevated blood-pressure reading, without diagnosis of hypertension: Secondary | ICD-10-CM | POA: Diagnosis not present

## 2021-11-25 MED ORDER — AMLODIPINE BESYLATE 5 MG PO TABS: 5.0000 mg | ORAL_TABLET | Freq: Every day | ORAL | 1 refills | Status: DC

## 2021-11-25 NOTE — Patient Instructions (Signed)
It was great to see you!  Please start amlodipine 5 mg daily.  Continue to monitor your blood pressure  Let's follow-up in 1 month, sooner if you have concerns.  Take care,  [name] PA-C

## 2021-11-25 NOTE — Progress Notes (Signed)
Brittney Burke is a 61 y.o. female here for a follow up on elevated blood pressure readings.   History of Present Illness:   Chief Complaint  Patient presents with   f/u on elevated blood pressure    Pt has been checking blood pressure over the weekend was 150/90, last night was 160/106    HPI  Elevated BP Readings  Patient here to follow up. Currently taking no medication. At home blood pressure readings are: checked regularly. States BP at home was 150/90 and 160/109 yesterday night. Patient is interested to start on Amlodipine 5 mg daily at this time. Patient denies chest pain, SOB, blurred vision, dizziness, unusual headaches, lower leg swelling. Denies excessive caffeine intake, stimulant usage, excessive alcohol intake, or increase in salt consumption.  BP Readings from Last 3 Encounters:  11/25/21 (!) 160/90  11/18/21 (!) 198/120  01/27/20 138/80    Past Medical History:  Diagnosis Date   Allergy    GERD (gastroesophageal reflux disease)    Kidney stone    SKIN CANCER, HX OF 01/05/2007   VERTIGO 04/01/2010     Social History   Tobacco Use   Smoking status: Never   Smokeless tobacco: Never  Substance Use Topics   Alcohol use: Yes    Alcohol/week: 7.0 standard drinks of alcohol    Types: 7 Standard drinks or equivalent per week    Comment: glass of wine each day   Drug use: No    Past Surgical History:  Procedure Laterality Date   BREAST CYST EXCISION  age 71   benign   CERVICAL DISC SURGERY     DILATION AND CURETTAGE OF UTERUS  1998    Family History  Problem Relation Age of Onset   Breast cancer Mother    Diabetes Mother    Hypertension Mother    Diabetes Father        mother   Heart attack Father 4   Transient ischemic attack Father 53   Bladder Cancer Father    Hypertension Father    Colon cancer Paternal Grandfather 90   Leukemia Brother 36       CLL   Breast cancer Maternal Aunt    Breast cancer Cousin    Uterine cancer Paternal Grandmother      No Known Allergies  Current Medications:   Current Outpatient Medications:    amLODipine (NORVASC) 5 MG tablet, Take 1 tablet (5 mg total) by mouth daily., Disp: 30 tablet, Rfl: 1   cetirizine (ZYRTEC) 10 MG tablet, Take 10 mg by mouth as needed.  , Disp: , Rfl:    Cholecalciferol (VITAMIN D3) 1000 UNITS CAPS, Take by mouth daily. , Disp: , Rfl:    Review of Systems:   ROS Negative unless otherwise specified per HPI.   Vitals:   Vitals:   11/25/21 0802 11/25/21 0820  BP: (!) 180/100 (!) 160/90  Pulse: 95 90  Temp: 98.1 F (36.7 C)   TempSrc: Temporal   SpO2: 98%   Weight: 140 lb 8 oz (63.7 kg)   Height: 5\' 5"  (1.651 m)      Body mass index is 23.38 kg/m.  Physical Exam:   Physical Exam Vitals and nursing note reviewed.  Constitutional:      General: She is not in acute distress.    Appearance: She is well-developed. She is not ill-appearing or toxic-appearing.  Cardiovascular:     Rate and Rhythm: Normal rate and regular rhythm.     Pulses: Normal pulses.  Heart sounds: Normal heart sounds, S1 normal and S2 normal.  Pulmonary:     Effort: Pulmonary effort is normal.     Breath sounds: Normal breath sounds.  Skin:    General: Skin is warm and dry.  Neurological:     Mental Status: She is alert.     GCS: GCS eye subscore is 4. GCS verbal subscore is 5. GCS motor subscore is 6.  Psychiatric:        Speech: Speech normal.        Behavior: Behavior normal. Behavior is cooperative.     Assessment and Plan:   Elevated blood pressure reading Above goal No evidence of end-organ damage Start norvasc 5 mg daily Continue to monitor BP at home Follow-up in 1 month with PCP (or me if he is unavailable) -- sooner if concerns   I,Savera Zaman,acting as a scribe for [employer], PA.,have documented all relevant documentation on the behalf of Jarold Motto, PA,as directed by  Jarold Motto, PA while in the presence of Jarold Motto, [place].   I,  Jarold Motto, [place], have reviewed all documentation for this visit. The documentation on 11/25/21 for the exam, diagnosis, procedures, and orders are all accurate and complete.   Jarold Motto, PA-C

## 2021-12-30 ENCOUNTER — Ambulatory Visit (INDEPENDENT_AMBULATORY_CARE_PROVIDER_SITE_OTHER): Payer: Managed Care, Other (non HMO) | Admitting: Family Medicine

## 2021-12-30 ENCOUNTER — Encounter: Payer: Self-pay | Admitting: Family Medicine

## 2021-12-31 NOTE — Progress Notes (Signed)
I was running significantly behind and patient had a prior engagement scheduled-we rescheduled patient for follow-up

## 2022-01-06 ENCOUNTER — Encounter: Payer: Self-pay | Admitting: Family Medicine

## 2022-01-06 ENCOUNTER — Ambulatory Visit: Payer: Managed Care, Other (non HMO) | Admitting: Family Medicine

## 2022-01-06 VITALS — BP 130/86 | HR 96 | Temp 98.3°F | Ht 65.0 in | Wt 141.8 lb

## 2022-01-06 DIAGNOSIS — I10 Essential (primary) hypertension: Secondary | ICD-10-CM

## 2022-01-06 DIAGNOSIS — Z23 Encounter for immunization: Secondary | ICD-10-CM

## 2022-01-06 MED ORDER — AMLODIPINE BESYLATE 5 MG PO TABS: 5.0000 mg | ORAL_TABLET | Freq: Every day | ORAL | 3 refills | Status: DC

## 2022-01-06 NOTE — Progress Notes (Signed)
  Phone [phone] In person visit   Subjective:   TINLEIGH WHITMIRE is a 61 y.o. year old very pleasant female patient who presents for/with See problem oriented charting Chief Complaint  Patient presents with   Follow-up   Hypertension    Past Medical History-  Patient Active Problem List   Diagnosis Date Noted   Essential hypertension 01/06/2022    Priority: Medium    Osteopenia of lumbar spine 01/27/2020    Priority: Medium    Hyperlipidemia, unspecified 09/13/2019    Priority: Medium    Hyperglycemia 07/01/2015    Priority: Medium    History of melanoma 01/05/2007    Priority: Medium    Allergic rhinitis 07/01/2015    Priority: Low   Vitamin D deficiency 07/01/2015    Priority: Low   GERD (gastroesophageal reflux disease) 07/01/2015    Priority: Low   Arthritis of left ankle 07/01/2015    Priority: Low   History of adenomatous polyp of colon 07/01/2015    Priority: Low   VERTIGO 04/01/2010    Priority: Low   Chronic pansinusitis 07/04/2016    Medications- reviewed and updated Current Outpatient Medications  Medication Sig Dispense Refill   cetirizine (ZYRTEC) 10 MG tablet Take 10 mg by mouth as needed.       Cholecalciferol (VITAMIN D3) 1000 UNITS CAPS Take by mouth daily.      amLODipine (NORVASC) 5 MG tablet Take 1 tablet (5 mg total) by mouth daily. 90 tablet 3   No current facility-administered medications for this visit.     Objective:  BP 130/86   Pulse 96   Temp 98.3 F (36.8 C)   Ht '5\' 5"'$  (1.651 m)   Wt 141 lb 12.8 oz (64.3 kg)   SpO2 98%   BMI 23.60 kg/m  Gen: NAD, resting comfortably CV: RRR no murmurs rubs or gallops Lungs: CTAB no crackles, wheeze, rhonchi Ext: no edema Skin: warm, dry     Assessment and Plan   #hypertension S: medication: amlodipine 5 mg. Mild dizziness first few days and has done well sinc ethat time Home readings #s: most recent reading 125/76 last night -had coffee this am and board meeting this AM BP  Readings from Last 3 Encounters:  01/06/22 130/86  12/30/21 120/78  11/25/21 (!) 160/90  A/P: much improved control on amlodipine 5 mg- good control in office and even better control at home- continue current medicatoins  #Elevated LFTs- mild- but reports was taking OTC. 2 glasses daily of wine- 2ill go back to 1 and had been taking nyquil when elevated- recheck next visit  #colonoscopy- has tried twice to get in- she is overdue- they state Dr. Modena Nunnery is booked   Recommended follow up: Return in about 4 months (around 05/08/2022) for physical or sooner if needed.Schedule b4 you leave. No future appointments.  Lab/Order associations:   ICD-10-CM   1. Essential hypertension  I10       Meds ordered this encounter  Medications   amLODipine (NORVASC) 5 MG tablet    Sig: Take 1 tablet (5 mg total) by mouth daily.    Dispense:  90 tablet    Refill:  3    Return precautions advised.  Garret Reddish, MD

## 2022-01-06 NOTE — Patient Instructions (Addendum)
Flu shot- we should have these available within a month or two but please let us know if you get at outside pharmacy.  Great job getting bp down/being consistent with med. Goal at home <135/85 on average  [facility] contact Please call to schedule visit and/or procedure Address: [address], [place], [place] [address] Phone: [phone]   Tdap today  Recommended follow up: Return in about 4 months (around 05/08/2022) for physical or sooner if needed.Schedule b4 you leave.

## 2022-01-06 NOTE — Addendum Note (Signed)
Addended by: Bing Neighbors on: 01/06/2022 09:49 AM   Modules accepted: Orders

## 2022-02-28 ENCOUNTER — Encounter: Payer: Self-pay | Admitting: *Deleted

## 2022-04-21 ENCOUNTER — Encounter: Payer: Self-pay | Admitting: Gastroenterology

## 2022-05-19 ENCOUNTER — Encounter: Payer: Self-pay | Admitting: *Deleted

## 2022-05-25 ENCOUNTER — Ambulatory Visit (AMBULATORY_SURGERY_CENTER): Payer: Managed Care, Other (non HMO)

## 2022-05-25 VITALS — Ht 65.0 in | Wt 142.0 lb

## 2022-05-25 DIAGNOSIS — Z86010 Personal history of colonic polyps: Secondary | ICD-10-CM

## 2022-05-25 MED ORDER — NA SULFATE-K SULFATE-MG SULF 17.5-3.13-1.6 GM/177ML PO SOLN: 1.0000 | Freq: Once | ORAL | 0 refills | Status: AC

## 2022-05-25 NOTE — Progress Notes (Signed)
No egg or soy allergy known to patient  No issues known to pt with past sedation with any surgeries or procedures Patient denies ever being told they had issues or difficulty with intubation  No FH of Malignant Hyperthermia Pt is not on diet pills Pt is not on  home 02  Pt is not on blood thinners  Pt denies issues with constipation  No A fib or A flutter  Pt instructed to use [email or web address] or GoodRx for a price reduction on prep

## 2022-06-23 ENCOUNTER — Encounter: Payer: Self-pay | Admitting: Gastroenterology

## 2022-06-24 ENCOUNTER — Ambulatory Visit (AMBULATORY_SURGERY_CENTER): Payer: Managed Care, Other (non HMO) | Admitting: Gastroenterology

## 2022-06-24 ENCOUNTER — Encounter: Payer: Self-pay | Admitting: Gastroenterology

## 2022-06-24 VITALS — BP 188/88 | HR 71 | Temp 97.7°F | Resp 13 | Ht 65.0 in | Wt 142.0 lb

## 2022-06-24 DIAGNOSIS — D120 Benign neoplasm of cecum: Secondary | ICD-10-CM | POA: Diagnosis not present

## 2022-06-24 DIAGNOSIS — Z09 Encounter for follow-up examination after completed treatment for conditions other than malignant neoplasm: Secondary | ICD-10-CM | POA: Diagnosis not present

## 2022-06-24 DIAGNOSIS — Z86010 Personal history of colonic polyps: Secondary | ICD-10-CM

## 2022-06-24 MED ORDER — SODIUM CHLORIDE 0.9 % IV SOLN: 500.0000 mL | Freq: Once | INTRAVENOUS | Status: DC

## 2022-06-24 NOTE — Progress Notes (Signed)
To pacu, VSS. Report to [email or web address].tb 

## 2022-06-24 NOTE — Op Note (Signed)
Brittney Burke: Brittney Burke Procedure Date: 06/24/2022 8:37 AM MRN: [id number] Endoscopist: Thornton Park MD, MD, [id number] Age: 62 Referring MD:  Date of Birth: 1961-05-26 Gender: Female Account #: [id number] Procedure:                Colonoscopy Indications:              Screening for colorectal malignant neoplasm                           Colonoscopy with Dr. Deatra Ina in 2015 revealed a 3 mm                            sigmoid tubular adenoma and sigmoid diverticulosis. Medicines:                Monitored Anesthesia Care Procedure:                Pre-Anesthesia Assessment:                           - Prior to the procedure, a History and Physical                            was performed, and patient medications and                            allergies were reviewed. The patient's tolerance of                            previous anesthesia was also reviewed. The risks                            and benefits of the procedure and the sedation                            options and risks were discussed with the patient.                            All questions were answered, and informed consent                            was obtained. Prior Anticoagulants: The patient has                            taken no anticoagulant or antiplatelet agents. ASA                            Grade Assessment: II - A patient with mild systemic                            disease. After reviewing the risks and benefits,                            the patient was deemed in satisfactory condition to  undergo the procedure.                           After obtaining informed consent, the colonoscope                            was passed under direct vision. Throughout the                            procedure, the patient's blood pressure, pulse, and                            oxygen saturations were monitored continuously. The                            CF  [id number] #[id number] was introduced through the anus                            and advanced to the 3 cm into the ileum. A second                            forward view of the right colon was performed. The                            colonoscopy was performed without difficulty. The                            patient tolerated the procedure well. The quality                            of the bowel preparation was good. The terminal                            ileum, ileocecal valve, appendiceal orifice, and                            rectum were photographed. Scope In: 8:51:24 AM Scope Out: 9:04:05 AM Scope Withdrawal Time: 0 hours 7 minutes 10 seconds  Total Procedure Duration: 0 hours 12 minutes 41 seconds  Findings:                 The perianal and digital rectal examinations were                            normal.                           Non-bleeding internal hemorrhoids were found.                           Multiple medium-mouthed and small-mouthed                            diverticula were found in the sigmoid colon and  distal descending colon.                           A 2 mm polyp was found in the cecum. The polyp was                            sessile. The polyp was removed with a cold snare.                            Resection and retrieval were complete. Estimated                            blood loss was minimal.                           The exam was otherwise without abnormality on                            direct and retroflexion views. Complications:            No immediate complications. Estimated Blood Loss:     Estimated blood loss was minimal. Impression:               - Non-bleeding internal hemorrhoids.                           - Diverticulosis in the sigmoid colon and in the                            distal descending colon.                           - One 2 mm polyp in the cecum, removed with a cold                            snare.  Resected and retrieved.                           - The examination was otherwise normal on direct                            and retroflexion views. Recommendation:           - Patient has a contact number available for                            emergencies. The signs and symptoms of potential                            delayed complications were discussed with the                            patient. Return to normal activities tomorrow.                            Written discharge instructions were  provided to the                            patient.                           - Continue present medications.                           - Await pathology results.                           - Repeat colonoscopy in 7 years for surveillance.                           - Follow a high fiber diet. Drink at least 64                            ounces of water daily. Add a daily stool bulking                            agent such as psyllium (an exampled would be                            Metamucil).                           - Emerging evidence supports eating a diet of                            fruits, vegetables, grains, calcium, and yogurt                            while reducing red meat and alcohol may reduce the                            risk of colon cancer.                           - Thank you for allowing me to be involved in your                            colon cancer prevention. Thornton Park MD, MD 06/24/2022 9:11:17 AM This report has been signed electronically.

## 2022-06-24 NOTE — Patient Instructions (Signed)
Please read handouts provided. Continue present medications. Await pathology results. Repeat colonoscopy in 7 years for screening. Follow a high fiber diet.   YOU HAD AN ENDOSCOPIC PROCEDURE TODAY AT [facility]:   Refer to the procedure report that was given to you for any specific questions about what was found during the examination.  If the procedure report does not answer your questions, please call your gastroenterologist to clarify.  If you requested that your care partner not be given the details of your procedure findings, then the procedure report has been included in a sealed envelope for you to review at your convenience later.  YOU SHOULD EXPECT: Some feelings of bloating in the abdomen. Passage of more gas than usual.  Walking can help get rid of the air that was put into your GI tract during the procedure and reduce the bloating. If you had a lower endoscopy (such as a colonoscopy or flexible sigmoidoscopy) you may notice spotting of blood in your stool or on the toilet paper. If you underwent a bowel prep for your procedure, you may not have a normal bowel movement for a few days.  Please Note:  You might notice some irritation and congestion in your nose or some drainage.  This is from the oxygen used during your procedure.  There is no need for concern and it should clear up in a day or so.  SYMPTOMS TO REPORT IMMEDIATELY:  Following lower endoscopy (colonoscopy or flexible sigmoidoscopy):  Excessive amounts of blood in the stool  Significant tenderness or worsening of abdominal pains  Swelling of the abdomen that is new, acute  Fever of 100F or higher   For urgent or emergent issues, a gastroenterologist can be reached at any hour by calling [phone]. Do not use MyChart messaging for urgent concerns.    DIET:  We do recommend a small meal at first, but then you may proceed to your regular diet.  Drink plenty of fluids but you should avoid alcoholic  beverages for 24 hours.  ACTIVITY:  You should plan to take it easy for the rest of today and you should NOT DRIVE or use heavy machinery until tomorrow (because of the sedation medicines used during the test).    FOLLOW UP: Our staff will call the number listed on your records the next business day following your procedure.  We will call around 7:15- 8:00 am to check on you and address any questions or concerns that you may have regarding the information given to you following your procedure. If we do not reach you, we will leave a message.     If any biopsies were taken you will be contacted by phone or by letter within the next 1-3 weeks.  Please call us at [phone] if you have not heard about the biopsies in 3 weeks.    SIGNATURES/CONFIDENTIALITY: You and/or your care partner have signed paperwork which will be entered into your electronic medical record.  These signatures attest to the fact that that the information above on your After Visit Summary has been reviewed and is understood.  Full responsibility of the confidentiality of this discharge information lies with you and/or your care-partner.

## 2022-06-24 NOTE — Progress Notes (Signed)
   Referring Provider: Marin Olp, MD Primary Care Physician:  Marin Olp, MD  Indication for Procedure:  Colon cancer Surveillance   IMPRESSION:  Need for colon cancer surveillance Appropriate candidate for monitored anesthesia care  PLAN: Colonoscopy in the [place] today   HPI: Brittney Burke is a 62 y.o. female presents for surveillance colonoscopy.  Prior endoscopic history: Colonoscopy with Dr. Deatra Ina in 2015 revealed a 3 mm sigmoid tubular adenoma and sigmoid diverticulosis.  No baseline GI symptoms.   No known family history of colon cancer or polyps. No family history of uterine/endometrial cancer, pancreatic cancer or gastric/stomach cancer.   Past Medical History:  Diagnosis Date   Allergy    GERD (gastroesophageal reflux disease)    Hypertension    Kidney stone    SKIN CANCER, HX OF 01/05/2007   VERTIGO 04/01/2010    Past Surgical History:  Procedure Laterality Date   BREAST CYST EXCISION  age 89   benign   CERVICAL DISC SURGERY     DILATION AND CURETTAGE OF UTERUS  1998    Current Outpatient Medications  Medication Sig Dispense Refill   amLODipine (NORVASC) 5 MG tablet Take 1 tablet (5 mg total) by mouth daily. 90 tablet 3   cetirizine (ZYRTEC) 10 MG tablet Take 10 mg by mouth as needed.       Cholecalciferol (VITAMIN D3) 1000 UNITS CAPS Take by mouth daily.      No current facility-administered medications for this visit.    Allergies as of 06/24/2022   (No Known Allergies)    Family History  Problem Relation Age of Onset   Breast cancer Mother    Diabetes Mother    Hypertension Mother    Diabetes Father        mother   Heart attack Father 75   Transient ischemic attack Father 58   Bladder Cancer Father    Hypertension Father    Leukemia Brother 70       CLL   Breast cancer Maternal Aunt    Uterine cancer Paternal Grandmother    Colon cancer Paternal Grandfather 53   Breast cancer Cousin    Esophageal cancer Neg Hx     Rectal cancer Neg Hx    Stomach cancer Neg Hx      Physical Exam: General:   Alert,  well-nourished, pleasant and cooperative in NAD Head:  Normocephalic and atraumatic. Eyes:  Sclera clear, no icterus.   Conjunctiva pink. Mouth:  No deformity or lesions.   Neck:  Supple; no masses or thyromegaly. Lungs:  Clear throughout to auscultation.   No wheezes. Heart:  Regular rate and rhythm; no murmurs. Abdomen:  Soft, non-tender, nondistended, normal bowel sounds, no rebound or guarding.  Msk:  Symmetrical. No boney deformities LAD: No inguinal or umbilical LAD Extremities:  No clubbing or edema. Neurologic:  Alert and  oriented x4;  grossly nonfocal Skin:  No obvious rash or bruise. Psych:  Alert and cooperative. Normal mood and affect.     Studies/Results: No results found.    Britni Driscoll L. Tarri Glenn, MD, MPH 06/24/2022, 7:34 AM

## 2022-06-24 NOTE — Progress Notes (Signed)
Pt's states no medical or surgical changes since previsit or office visit. VS assessed by D.T

## 2022-06-24 NOTE — Progress Notes (Signed)
Called to room to assist during endoscopic procedure.  Patient ID and intended procedure confirmed with present staff. Received instructions for my participation in the procedure from the performing physician.

## 2022-06-27 ENCOUNTER — Telehealth: Payer: Self-pay | Admitting: *Deleted

## 2022-06-27 NOTE — Telephone Encounter (Signed)
Follow up call attempt.  LVM to call if any questions or concerns. 

## 2022-06-29 ENCOUNTER — Ambulatory Visit (INDEPENDENT_AMBULATORY_CARE_PROVIDER_SITE_OTHER): Payer: Managed Care, Other (non HMO) | Admitting: Family Medicine

## 2022-06-29 ENCOUNTER — Encounter: Payer: Self-pay | Admitting: Family Medicine

## 2022-06-29 VITALS — BP 128/86 | HR 81 | Temp 98.3°F | Ht 65.0 in | Wt 145.2 lb

## 2022-06-29 DIAGNOSIS — Z0000 Encounter for general adult medical examination without abnormal findings: Secondary | ICD-10-CM | POA: Diagnosis not present

## 2022-06-29 DIAGNOSIS — R739 Hyperglycemia, unspecified: Secondary | ICD-10-CM

## 2022-06-29 DIAGNOSIS — E559 Vitamin D deficiency, unspecified: Secondary | ICD-10-CM | POA: Diagnosis not present

## 2022-06-29 DIAGNOSIS — E785 Hyperlipidemia, unspecified: Secondary | ICD-10-CM | POA: Diagnosis not present

## 2022-06-29 LAB — HEMOGLOBIN A1C: Hgb A1c MFr Bld: 6.1 % (ref 4.6–6.5)

## 2022-06-29 LAB — COMPREHENSIVE METABOLIC PANEL
ALT: 35 U/L (ref 0–35)
AST: 25 U/L (ref 0–37)
Albumin: 4.6 g/dL (ref 3.5–5.2)
Alkaline Phosphatase: 84 U/L (ref 39–117)
BUN: 12 mg/dL (ref 6–23)
CO2: 29 mEq/L (ref 19–32)
Calcium: 10 mg/dL (ref 8.4–10.5)
Chloride: 98 mEq/L (ref 96–112)
Creatinine, Ser: 0.56 mg/dL (ref 0.40–1.20)
GFR: 97.34 mL/min (ref >60.00)
Glucose, Bld: 86 mg/dL (ref 70–99)
Potassium: 4 mEq/L (ref 3.5–5.1)
Sodium: 138 mEq/L (ref 135–145)
Total Bilirubin: 0.5 mg/dL (ref 0.2–1.2)
Total Protein: 7.5 g/dL (ref 6.0–8.3)

## 2022-06-29 LAB — LIPID PANEL
Cholesterol: 240 mg/dL — ABNORMAL HIGH (ref 0–200)
HDL: 70 mg/dL (ref >39.00)
LDL Cholesterol: 140 mg/dL — ABNORMAL HIGH (ref 0–99)
NonHDL: 169.54
Total CHOL/HDL Ratio: 3
Triglycerides: 150 mg/dL — ABNORMAL HIGH (ref 0.0–149.0)
VLDL: 30 mg/dL (ref 0.0–40.0)

## 2022-06-29 LAB — CBC WITH DIFFERENTIAL/PLATELET
Basophils Absolute: 0.1 10*3/uL (ref 0.0–0.1)
Basophils Relative: 1.3 % (ref 0.0–3.0)
Eosinophils Absolute: 0.2 10*3/uL (ref 0.0–0.7)
Eosinophils Relative: 3.2 % (ref 0.0–5.0)
HCT: 47 % — ABNORMAL HIGH (ref 36.0–46.0)
Hemoglobin: 16.1 g/dL — ABNORMAL HIGH (ref 12.0–15.0)
Lymphocytes Relative: 35 % (ref 12.0–46.0)
Lymphs Abs: 2 10*3/uL (ref 0.7–4.0)
MCHC: 34.4 g/dL (ref 30.0–36.0)
MCV: 97.5 fl (ref 78.0–100.0)
Monocytes Absolute: 0.5 10*3/uL (ref 0.1–1.0)
Monocytes Relative: 9.5 % (ref 3.0–12.0)
Neutro Abs: 2.9 10*3/uL (ref 1.4–7.7)
Neutrophils Relative %: 51 % (ref 43.0–77.0)
Platelets: 256 10*3/uL (ref 150.0–400.0)
RBC: 4.82 Mil/uL (ref 3.87–5.11)
RDW: 12.3 % (ref 11.5–15.5)
WBC: 5.7 10*3/uL (ref 4.0–10.5)

## 2022-06-29 LAB — VITAMIN D 25 HYDROXY (VIT D DEFICIENCY, FRACTURES): VITD: 36.29 ng/mL (ref 30.00–100.00)

## 2022-06-29 NOTE — Progress Notes (Signed)
Phone [phone]   Subjective:  Patient presents today for their annual physical. Chief complaint-noted.   See problem oriented charting- ROS- full  review of systems was completed and negative except for: tinnitus and seasonal allergies and positional vertigo- saw neurology in past- not thought to be meniere's   The following were reviewed and entered/updated in epic: Past Medical History:  Diagnosis Date   Allergy    GERD (gastroesophageal reflux disease)    Hypertension    Kidney stone    SKIN CANCER, HX OF 01/05/2007   VERTIGO 04/01/2010   Patient Active Problem List   Diagnosis Date Noted   Essential hypertension 01/06/2022    Priority: Medium    Osteopenia of lumbar spine 01/27/2020    Priority: Medium    Hyperlipidemia, unspecified 09/13/2019    Priority: Medium    Hyperglycemia 07/01/2015    Priority: Medium    History of melanoma 01/05/2007    Priority: Medium    Allergic rhinitis 07/01/2015    Priority: Low   Vitamin D deficiency 07/01/2015    Priority: Low   GERD (gastroesophageal reflux disease) 07/01/2015    Priority: Low   Arthritis of left ankle 07/01/2015    Priority: Low   History of adenomatous polyp of colon 07/01/2015    Priority: Low   VERTIGO 04/01/2010    Priority: Low   Chronic pansinusitis 07/04/2016   Past Surgical History:  Procedure Laterality Date   BREAST CYST EXCISION  age 41   benign   CERVICAL DISC SURGERY     DILATION AND CURETTAGE OF UTERUS  1998    Family History  Problem Relation Age of Onset   Breast cancer Mother    Diabetes Mother    Hypertension Mother    Diabetes Father        mother   Heart attack Father 42   Transient ischemic attack Father 52   Bladder Cancer Father    Hypertension Father    Leukemia Brother 65       CLL   Breast cancer Maternal Aunt    Uterine cancer Paternal Grandmother    Colon cancer Paternal Grandfather 71   Breast cancer Cousin    Esophageal cancer Neg Hx    Rectal cancer Neg  Hx    Stomach cancer Neg Hx     Medications- reviewed and updated Current Outpatient Medications  Medication Sig Dispense Refill   amLODipine (NORVASC) 5 MG tablet Take 1 tablet (5 mg total) by mouth daily. 90 tablet 3   cetirizine (ZYRTEC) 10 MG tablet Take 10 mg by mouth as needed.       Cholecalciferol (VITAMIN D3) 1000 UNITS CAPS Take by mouth daily.      No current facility-administered medications for this visit.    Allergies-reviewed and updated No Known Allergies  Social History   Social History Narrative   Married. No children.    Lost step mom during pandemic. 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   Enjoys work but very stressful- a lot of travel      [place], cooking, yard work.    Yoga 2x a week up to 5 times a week.    Objective  Objective:  BP 128/86   Pulse 81   Temp 98.3 F (36.8 C)   Ht '5\' 5"'$  (1.651 m)   Wt 145 lb 3.2 oz (65.9 kg)   SpO2  100%   BMI 24.16 kg/m  Gen: NAD, resting comfortably HEENT: Mucous membranes are moist. Oropharynx normal Neck: no thyromegaly CV: RRR no murmurs rubs or gallops Lungs: CTAB no crackles, wheeze, rhonchi Abdomen: soft/nontender/nondistended/normal bowel sounds. No rebound or guarding.  Ext: no edema Skin: warm, dry Neuro: grossly normal, moves all extremities, PERRLA   Assessment and Plan   62 y.o. female presenting for annual physical.  Health Maintenance counseling: 1. Anticipatory guidance: Patient counseled regarding regular dental exams -q3 months, eye exams - plans to schedule with new eye doctor- hers retired,  avoiding smoking and second hand smoke, limiting alcohol to 1 beverage per day- 1 glass a day (perhaps slightly more on holidays) , no illicit drugs .   2. Risk factor reduction:  Advised patient of need for regular exercise and diet rich and fruits and vegetables to reduce risk of heart attack and stroke.  Exercise- yoga once a  week and doing stairs at work for 3 flights- plans to improve by adding more yoga and walking (got walking sticks)- when weather better plans to golf as well.  Diet/weight management-significant prior weight loss and has been able to maintain in the 140s thankfully- felt like gained maybe 2 lbs on amlodipine. Trying to eat reasonably healthy- gave dash eating plan Wt Readings from Last 3 Encounters:  06/29/22 145 lb 3.2 oz (65.9 kg)  06/24/22 142 lb (64.4 kg)  05/25/22 142 lb (64.4 kg)  3. Immunizations/screenings/ancillary studies-discussed Shingrix- opts out, opts out of flu and COVID shot- had bad vertigo after last covid shot  Immunization History  Administered Date(s) Administered   Moderna Covid Bivalent Peds Booster([id number] 04/04/2020   Moderna Sars-Covid-2 Vaccination 08/19/2019, 09/09/2019   Td 08/27/2008   Tdap 08/27/2008, 12/21/2010, 01/06/2022  4. Cervical cancer screening- September 17, 2019 with 3-year repeat planned[facility]   5. Breast cancer screening-  breast exam with gynecology and mammogram November 24, 2021 mother with breast cancer in her 501s  6Colon cancer screening -June 24, 2022 with plan 7-year follow-up 7. Skin cancer screening- Dr. WhElvera Lennoxith melanoma history- has not seen lately- encouraged  advised regular sunscreen use. Denies worrisome, changing, or new skin lesions.  8. Birth control/STD check- postmenopausal and monogamous 9. Osteoporosis screening at 6576osteopenia followed by GYN last done 11/06/19. She will check with GYn this year- discussed calcium through diet, vitamin D, weight bearing exercise -Never smoker  Status of chronic or acute concerns   #hypertension S: medication: Amlodipine 5 mg each morning Home readings #s: after yoga 130-135/80-90, outside of that doesn't check often BP Readings from Last 3 Encounters:  06/29/22 128/86  06/24/22 (!) 188/88  01/06/22 130/86  A/P: reasonably stable- we discussed options of  pushing <135/85 per aafp guidelines as an option vs acc guidelines 130/80 but ultimately opted to work on lifestyle and continue current medications  -if it trends up furhter she will let usKoreanow   #hyperlipidemia S: Medication:none  The 10-year ASCVD risk score (Arnett DK, et al., 2019) is: 5.4% - heart disease in 8060sn dad, both parents in 9036sab Results  Component Value Date   CHOL 251 (H) 01/27/2020   HDL 90 01/27/2020   LDLCALC 129 (H) 01/27/2020   LDLDIRECT 118.5 03/24/2010   TRIG 186 (H) 01/27/2020   CHOLHDL 2.8 01/27/2020  A/P: update lipids- work on lifestyle- and as long as risk under 7.5% hold off on other meds    # Hyperglycemia/insulin resistance/prediabetes S:  Medication: none. High fasting sugars in past but lost weight and a1c has been fine Lab Results  Component Value Date   HGBA1C 5.5 01/27/2020   HGBA1C 5.5 07/10/2015   HGBA1C 5.4 08/21/2013  A/P: suspect stable as maintained weight- update a1c- thankful for prior weight loss   #Vitamin D deficiency S: Medication: 2000 units a day Last vitamin D Lab Results  Component Value Date   VD25OH 31 01/27/2020   A/P: hopefully stable- update vitamin D today. Continue current meds for now    Recommended follow up: Return in about 1 year (around 06/30/2023) for physical or sooner if needed.Schedule b4 you leave.  Lab/Order associations:NOT fasting   ICD-10-CM   1. Preventative health care  Z00.00     2. Hyperlipidemia, unspecified hyperlipidemia type  E78.5     3. Hyperglycemia  R73.9     4. Vitamin D deficiency  E55.9       No orders of the defined types were placed in this encounter.   Return precautions advised.  Garret Reddish, MD

## 2022-06-29 NOTE — Patient Instructions (Addendum)
Please stop by lab before you go If you have mychart- we will send your results within 3 business days of [place] receiving them.  If you do not have mychart- we will call you about results within 5 business days of [place] receiving them.  *please also note that you will see labs on mychart as soon as they post. I will later go in and write notes on them- will say "notes from Dr. Yong Channel"   Schedule follow up with Dr. Farrel Demark to schedule husband as new patient for a physical  Recommended follow up: Return in about 1 year (around 06/30/2023) for physical or sooner if needed.Schedule b4 you leave.  DASH Eating Plan DASH stands for Dietary Approaches to Stop Hypertension. The DASH eating plan is a healthy eating plan that has been shown to: Reduce high blood pressure (hypertension). Reduce your risk for type 2 diabetes, heart disease, and stroke. Help with weight loss. What are tips for following this plan? Reading food labels Check food labels for the amount of salt (sodium) per serving. Choose foods with less than 5 percent of the Daily Value of sodium. Generally, foods with less than 300 milligrams (mg) of sodium per serving fit into this eating plan. To find whole grains, look for the word "whole" as the first word in the ingredient list. Shopping Buy products labeled as "low-sodium" or "no salt added." Buy fresh foods. Avoid canned foods and pre-made or frozen meals. Cooking Avoid adding salt when cooking. Use salt-free seasonings or herbs instead of table salt or sea salt. Check with your health care provider or pharmacist before using salt substitutes. Do not fry foods. Cook foods using healthy methods such as baking, boiling, grilling, roasting, and broiling instead. Cook with heart-healthy oils, such as olive, canola, avocado, soybean, or sunflower oil. Meal planning  Eat a balanced diet that includes: 4 or more servings of fruits and 4 or more servings of vegetables each day. Try to  fill one-half of your plate with fruits and vegetables. 6-8 servings of whole grains each day. Less than 6 oz (170 g) of lean meat, poultry, or fish each day. A 3-oz (85-g) serving of meat is about the same size as a deck of cards. One egg equals 1 oz (28 g). 2-3 servings of low-fat dairy each day. One serving is 1 cup (237 mL). 1 serving of nuts, seeds, or beans 5 times each week. 2-3 servings of heart-healthy fats. Healthy fats called omega-3 fatty acids are found in foods such as walnuts, flaxseeds, fortified milks, and eggs. These fats are also found in cold-water fish, such as sardines, salmon, and mackerel. Limit how much you eat of: Canned or prepackaged foods. Food that is high in trans fat, such as some fried foods. Food that is high in saturated fat, such as fatty meat. Desserts and other sweets, sugary drinks, and other foods with added sugar. Full-fat dairy products. Do not salt foods before eating. Do not eat more than 4 egg yolks a week. Try to eat at least 2 vegetarian meals a week. Eat more home-cooked food and less restaurant, buffet, and fast food. Lifestyle When eating at a restaurant, ask that your food be prepared with less salt or no salt, if possible. If you drink alcohol: Limit how much you use to: 0-1 drink a day for women who are not pregnant. 0-2 drinks a day for men. Be aware of how much alcohol is in your drink. In the [place] one drink  equals one 12 oz bottle of beer (355 mL), one 5 oz glass of wine (148 mL), or one 1 oz glass of hard liquor (44 mL). General information Avoid eating more than 2,300 mg of salt a day. If you have hypertension, you may need to reduce your sodium intake to 1,500 mg a day. Work with your health care provider to maintain a healthy body weight or to lose weight. Ask what an ideal weight is for you. Get at least 30 minutes of exercise that causes your heart to beat faster (aerobic exercise) most days of the week. Activities may include  walking, swimming, or biking. Work with your health care provider or dietitian to adjust your eating plan to your individual calorie needs. What foods should I eat? Fruits All fresh, dried, or frozen fruit. Canned fruit in natural juice (without added sugar). Vegetables Fresh or frozen vegetables (raw, steamed, roasted, or grilled). Low-sodium or reduced-sodium tomato and vegetable juice. Low-sodium or reduced-sodium tomato sauce and tomato paste. Low-sodium or reduced-sodium canned vegetables. Grains Whole-grain or whole-wheat bread. Whole-grain or whole-wheat pasta. Brown rice. [place]. Bulgur. Whole-grain and low-sodium cereals. Pita bread. Low-fat, low-sodium crackers. Whole-wheat flour tortillas. Meats and other proteins Skinless chicken or [place]. Ground chicken or [place]. Pork with fat trimmed off. Fish and seafood. Egg whites. Dried beans, peas, or lentils. Unsalted nuts, nut butters, and seeds. Unsalted canned beans. Lean cuts of beef with fat trimmed off. Low-sodium, lean precooked or cured meat, such as sausages or meat loaves. Dairy Low-fat (1%) or fat-free (skim) milk. Reduced-fat, low-fat, or fat-free cheeses. Nonfat, low-sodium ricotta or cottage cheese. Low-fat or nonfat yogurt. Low-fat, low-sodium cheese. Fats and oils Soft margarine without trans fats. Vegetable oil. Reduced-fat, low-fat, or light mayonnaise and salad dressings (reduced-sodium). Canola, safflower, olive, avocado, soybean, and sunflower oils. Avocado. Seasonings and condiments Herbs. Spices. Seasoning mixes without salt. Other foods Unsalted popcorn and pretzels. Fat-free sweets. The items listed above may not be a complete list of foods and beverages you can eat. Contact a dietitian for more information. What foods should I avoid? Fruits Canned fruit in a light or heavy syrup. Fried fruit. Fruit in cream or butter sauce. Vegetables Creamed or fried vegetables. Vegetables in a cheese sauce. Regular  canned vegetables (not low-sodium or reduced-sodium). Regular canned tomato sauce and paste (not low-sodium or reduced-sodium). Regular tomato and vegetable juice (not low-sodium or reduced-sodium). Angie Fava. Olives. Grains Baked goods made with fat, such as croissants, muffins, or some breads. Dry pasta or rice meal packs. Meats and other proteins Fatty cuts of meat. Ribs. Fried meat. Berniece Salines. Bologna, salami, and other precooked or cured meats, such as sausages or meat loaves. Fat from the back of a pig (fatback). Bratwurst. Salted nuts and seeds. Canned beans with added salt. Canned or smoked fish. Whole eggs or egg yolks. Chicken or [place] with skin. Dairy Whole or 2% milk, cream, and half-and-half. Whole or full-fat cream cheese. Whole-fat or sweetened yogurt. Full-fat cheese. Nondairy creamers. Whipped toppings. Processed cheese and cheese spreads. Fats and oils Butter. Stick margarine. Lard. Shortening. Ghee. Bacon fat. Tropical oils, such as coconut, palm kernel, or palm oil. Seasonings and condiments Onion salt, garlic salt, seasoned salt, table salt, and sea salt. Worcestershire sauce. Tartar sauce. Barbecue sauce. Teriyaki sauce. Soy sauce, including reduced-sodium. Steak sauce. Canned and packaged gravies. Fish sauce. Oyster sauce. Cocktail sauce. Store-bought horseradish. Ketchup. Mustard. Meat flavorings and tenderizers. Bouillon cubes. Hot sauces. Pre-made or packaged marinades. Pre-made or packaged taco seasonings. Relishes. Regular  salad dressings. Other foods Salted popcorn and pretzels. The items listed above may not be a complete list of foods and beverages you should avoid. Contact a dietitian for more information. Where to find more information National Heart, Lung, and Blood Institute: https://wilson-eaton.com/ American Heart Association: www.heart.org Academy of Nutrition and Dietetics: www.eatright.[place]: www.kidney.org Summary The DASH eating plan is a  healthy eating plan that has been shown to reduce high blood pressure (hypertension). It may also reduce your risk for type 2 diabetes, heart disease, and stroke. When on the DASH eating plan, aim to eat more fresh fruits and vegetables, whole grains, lean proteins, low-fat dairy, and heart-healthy fats. With the DASH eating plan, you should limit salt (sodium) intake to 2,300 mg a day. If you have hypertension, you may need to reduce your sodium intake to 1,500 mg a day. Work with your health care provider or dietitian to adjust your eating plan to your individual calorie needs. This information is not intended to replace advice given to you by your health care provider. Make sure you discuss any questions you have with your health care provider. Document Revised: 04/26/2019 Document Reviewed: 04/26/2019 Elsevier Patient Education  [place].

## 2022-07-04 ENCOUNTER — Encounter: Payer: Self-pay | Admitting: Gastroenterology

## 2022-10-27 ENCOUNTER — Other Ambulatory Visit: Payer: Self-pay | Admitting: Family Medicine

## 2022-10-27 DIAGNOSIS — Z1231 Encounter for screening mammogram for malignant neoplasm of breast: Secondary | ICD-10-CM

## 2022-11-29 ENCOUNTER — Ambulatory Visit
Admission: RE | Admit: 2022-11-29 | Discharge: 2022-11-29 | Disposition: A | Discharge status: Home / Self Care | Payer: Managed Care, Other (non HMO) | Source: Ambulatory Visit | Attending: Family Medicine | Admitting: Family Medicine

## 2022-11-29 DIAGNOSIS — Z1231 Encounter for screening mammogram for malignant neoplasm of breast: Secondary | ICD-10-CM

## 2023-01-30 ENCOUNTER — Other Ambulatory Visit: Payer: Self-pay | Admitting: Family Medicine

## 2023-03-02 ENCOUNTER — Telehealth: Payer: Self-pay | Admitting: Family Medicine

## 2023-03-02 NOTE — Telephone Encounter (Signed)
Patient would like to update tetanus vaccine since has an upcoming project. Can we confirm if this is due?

## 2023-03-02 NOTE — Telephone Encounter (Signed)
Called and spoke with pt and verified that she is good on her tdap for 10 years, just had a recent on in 2023.

## 2023-03-10 ENCOUNTER — Telehealth: Payer: Self-pay | Admitting: Family Medicine

## 2023-03-10 MED ORDER — AMLODIPINE BESYLATE 5 MG PO TABS: 5.0000 mg | ORAL_TABLET | Freq: Every day | ORAL | 3 refills | Status: DC

## 2023-03-10 NOTE — Telephone Encounter (Signed)
Prescription Request  03/10/2023  LOV: 06/29/2022  What is the name of the medication or equipment? amLODipine (NORVASC) 5 MG tablet   Have you contacted your pharmacy to request a refill? Yes   Which pharmacy would you like this sent to?  [id number] [place], [place] - [address] [place] [address] Phone: [phone] Fax: [phone]    Patient notified that their request is being sent to the clinical staff for review and that they should receive a response within 2 business days.   Please advise at Mobile [phone] (mobile)

## 2023-03-10 NOTE — Telephone Encounter (Signed)
Refill sent to requested pharmacy.

## 2023-05-15 ENCOUNTER — Ambulatory Visit: Payer: Managed Care, Other (non HMO) | Admitting: Family Medicine

## 2023-05-15 VITALS — BP 144/80 | HR 82 | Temp 98.1°F | Ht 65.0 in | Wt 140.6 lb

## 2023-05-15 DIAGNOSIS — I10 Essential (primary) hypertension: Secondary | ICD-10-CM | POA: Diagnosis not present

## 2023-05-15 MED ORDER — AMLODIPINE BESYLATE 10 MG PO TABS: 10.0000 mg | ORAL_TABLET | Freq: Every day | ORAL | 5 refills | Status: DC

## 2023-05-15 NOTE — Assessment & Plan Note (Signed)
S: medication: Amlodipine 5 mg in am  -no chest pain or shortness of breath  Home readings #s: usually around 140's/80's recently - has remained very active- if anything is more active with retiremtn BP Readings from Last 3 Encounters:  05/15/23 (!) 144/80  06/29/22 128/86  06/24/22 (!) 188/88  A/P: blood pressure is above goal here and at home despite retiring and being more active- we opted to increase to amlodipine 10 mg -as back up we discussed amlodipine-valsartan 10-160 mg half tablet if gets edema - update me in 2-3 weeks with home readings and any side effects

## 2023-05-15 NOTE — Progress Notes (Signed)
Phone [phone] In person visit   Subjective:   Brittney Burke is a 62 y.o. year old very pleasant female patient who presents for/with See problem oriented charting Chief Complaint  Patient presents with   Hypertension     Pt states bp has been running high but she has not been keeping a log. (Pap requested from OBGYN)   Past Medical History-  Patient Active Problem List   Diagnosis Date Noted   Essential hypertension 01/06/2022    Priority: Medium    Osteopenia of lumbar spine 01/27/2020    Priority: Medium    Hyperlipidemia, unspecified 09/13/2019    Priority: Medium    Hyperglycemia 07/01/2015    Priority: Medium    History of melanoma 01/05/2007    Priority: Medium    Allergic rhinitis 07/01/2015    Priority: Low   Vitamin D deficiency 07/01/2015    Priority: Low   GERD (gastroesophageal reflux disease) 07/01/2015    Priority: Low   Arthritis of left ankle 07/01/2015    Priority: Low   History of adenomatous polyp of colon 07/01/2015    Priority: Low   VERTIGO 04/01/2010    Priority: Low   Chronic pansinusitis 07/04/2016    Medications- reviewed and updated Current Outpatient Medications  Medication Sig Dispense Refill   cetirizine (ZYRTEC) 10 MG tablet Take 10 mg by mouth as needed.       Cholecalciferol (VITAMIN D3) 1000 UNITS CAPS Take by mouth daily.      amLODipine (NORVASC) 10 MG tablet Take 1 tablet (10 mg total) by mouth daily. 30 tablet 5   No current facility-administered medications for this visit.     Objective:  BP (!) 144/80   Pulse 82   Temp 98.1 F (36.7 C)   Ht 5\' 5"  (1.651 m)   Wt 140 lb 9.6 oz (63.8 kg)   SpO2 99%   BMI 23.40 kg/m  Gen: NAD, resting comfortably CV: RRR no murmurs rubs or gallops Lungs: CTAB no crackles, wheeze, rhonchi Ext: no edema Skin: warm, dry     Assessment and Plan   #social update- mom not doing well at age 46 - kidneys failing   #hypertension S: medication: Amlodipine 5 mg in am  -no chest  pain or shortness of breath  Home readings #s: usually around 140's/80's recently - has remained very active- if anything is more active with retiremtn BP Readings from Last 3 Encounters:  05/15/23 (!) 144/80  06/29/22 128/86  06/24/22 (!) 188/88  A/P: blood pressure is above goal here and at home despite retiring and being more active- we opted to increase to amlodipine 10 mg -as back up we discussed amlodipine-valsartan 10-160 mg half tablet if gets edema - update me in 2-3 weeks with home readings and any side effects   # Hyperglycemia/insulin resistance/prediabetes S:  Medication: none Exercise and diet- more active and has lost a few lbs Lab Results  Component Value Date   HGBA1C 6.1 06/29/2022   HGBA1C 5.5 01/27/2020   HGBA1C 5.5 07/10/2015    A/P: likely improving- she wants to wait until next visit   #DEXA- discussed updating- wants to hold for now - discuss next visit  Recommended follow up: Return in about 4 months (around 09/13/2023) for physical or sooner if needed.Schedule b4 you leave.  Lab/Order associations: No diagnosis found.  Meds ordered this encounter  Medications   amLODipine (NORVASC) 10 MG tablet    Sig: Take 1 tablet (10 mg total) by mouth  daily.    Dispense:  30 tablet    Refill:  5    Return precautions advised.  Tana Conch, MD

## 2023-05-15 NOTE — Patient Instructions (Addendum)
blood pressure is above goal here and at home despite retiring and being more active- we opted to increase to amlodipine 10 mg -as back up we discussed amlodipine-valsartan 10-160 mg half tablet if gets edema - update me in 2-3 weeks with home readings and any side effects   Recommended follow up: Return in about 4 months (around 09/13/2023) for physical or sooner if needed.Schedule b4 you leave.

## 2023-08-25 ENCOUNTER — Telehealth: Payer: Self-pay

## 2023-08-25 NOTE — Telephone Encounter (Signed)
 FYI.  Copied from CRM [id number]. Topic: General - Other >> Aug 25, 2023  2:24 PM Almira Coaster wrote: Reason for CRM: Patient is calling to provide BP readings for the last couple of days, 08/23/2023 133/78 & 135/79, 08/24/2023 129/75 &134-77. She is stating that the new dose amLODipine (NORVASC) 10 MG tablet is working.

## 2023-08-25 NOTE — Telephone Encounter (Signed)
 Blood pressure looks excellent-continue current medication

## 2023-08-28 NOTE — Telephone Encounter (Signed)
 Called and lm on pt vm with below message.

## 2023-10-15 DIAGNOSIS — N390 Urinary tract infection, site not specified: Secondary | ICD-10-CM | POA: Diagnosis not present

## 2023-10-15 DIAGNOSIS — R319 Hematuria, unspecified: Secondary | ICD-10-CM | POA: Diagnosis not present

## 2023-10-26 ENCOUNTER — Ambulatory Visit (INDEPENDENT_AMBULATORY_CARE_PROVIDER_SITE_OTHER): Payer: Managed Care, Other (non HMO) | Admitting: Family Medicine

## 2023-10-26 ENCOUNTER — Encounter: Payer: Self-pay | Admitting: Family Medicine

## 2023-10-26 VITALS — BP 120/64 | HR 96 | Temp 98.2°F | Ht 65.0 in | Wt 140.4 lb

## 2023-10-26 DIAGNOSIS — Z789 Other specified health status: Secondary | ICD-10-CM

## 2023-10-26 DIAGNOSIS — I10 Essential (primary) hypertension: Secondary | ICD-10-CM | POA: Diagnosis not present

## 2023-10-26 DIAGNOSIS — R739 Hyperglycemia, unspecified: Secondary | ICD-10-CM

## 2023-10-26 DIAGNOSIS — M8588 Other specified disorders of bone density and structure, other site: Secondary | ICD-10-CM | POA: Diagnosis not present

## 2023-10-26 DIAGNOSIS — E785 Hyperlipidemia, unspecified: Secondary | ICD-10-CM | POA: Diagnosis not present

## 2023-10-26 DIAGNOSIS — Z131 Encounter for screening for diabetes mellitus: Secondary | ICD-10-CM

## 2023-10-26 DIAGNOSIS — E559 Vitamin D deficiency, unspecified: Secondary | ICD-10-CM

## 2023-10-26 DIAGNOSIS — Z0000 Encounter for general adult medical examination without abnormal findings: Secondary | ICD-10-CM | POA: Diagnosis not present

## 2023-10-26 NOTE — Progress Notes (Signed)
 Phone [phone]   Subjective:  Patient presents today for their annual physical. Chief complaint-noted.   See problem oriented charting- ROS- full  review of systems was completed and negative Per full ROS sheet completed by patient except for topics noted under acute/chronic concerns  The following were reviewed and entered/updated in epic: Past Medical History:  Diagnosis Date   Allergy    GERD (gastroesophageal reflux disease)    Hypertension    Kidney stone    SKIN CANCER, HX OF 01/05/2007   VERTIGO 04/01/2010   Patient Active Problem List   Diagnosis Date Noted   Essential hypertension 01/06/2022    Priority: Medium    Osteopenia of lumbar spine 01/27/2020    Priority: Medium    Hyperlipidemia, unspecified 09/13/2019    Priority: Medium    Hyperglycemia 07/01/2015    Priority: Medium    History of melanoma 01/05/2007    Priority: Medium    Allergic rhinitis 07/01/2015    Priority: Low   Vitamin D  deficiency 07/01/2015    Priority: Low   GERD (gastroesophageal reflux disease) 07/01/2015    Priority: Low   Arthritis of left ankle 07/01/2015    Priority: Low   History of adenomatous polyp of colon 07/01/2015    Priority: Low   VERTIGO 04/01/2010    Priority: Low   Chronic pansinusitis 07/04/2016   Past Surgical History:  Procedure Laterality Date   BREAST CYST EXCISION  age 32   benign   CERVICAL DISC SURGERY     DILATION AND CURETTAGE OF UTERUS  1998    Family History  Problem Relation Age of Onset   Breast cancer Mother    Diabetes Mother    Hypertension Mother    Kidney disease Mother    Heart failure Mother        likely diagnosis at age 47- peaceful death   Diabetes Father        mother   Heart attack Father 32   Transient ischemic attack Father 67   Bladder Cancer Father    Hypertension Father    Leukemia Brother 2       CLL   Uterine cancer Paternal Grandmother    Colon cancer Paternal Grandfather 14   Breast cancer Maternal Aunt     Breast cancer Cousin    Esophageal cancer Neg Hx    Rectal cancer Neg Hx    Stomach cancer Neg Hx     Medications- reviewed and updated Current Outpatient Medications  Medication Sig Dispense Refill   amLODipine  (NORVASC ) 10 MG tablet Take 1 tablet (10 mg total) by mouth daily. 30 tablet 5   cetirizine (ZYRTEC) 10 MG tablet Take 10 mg by mouth as needed.       Cholecalciferol (VITAMIN D3) 1000 UNITS CAPS Take by mouth daily.      No current facility-administered medications for this visit.    Allergies-reviewed and updated No Known Allergies  Social History   Social History Narrative   Married. No children.    Lost step mom during pandemic.  dad is local. 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    Enjoyed work but very stressful- a lot of travel      Hobbies: golfing, cooking, yard work.    Yoga 2x a week up to 5 times a week.    Objective  Objective:  BP 120/64   Pulse 96  Temp 98.2 F (36.8 C)   Ht 5\' 5"  (1.651 m)   Wt 140 lb 6.4 oz (63.7 kg)   SpO2 98%   BMI 23.36 kg/m  Gen: NAD, resting comfortably HEENT: Mucous membranes are moist. Oropharynx normal Neck: no thyromegaly CV: RRR no murmurs rubs or gallops Lungs: CTAB no crackles, wheeze, rhonchi Abdomen: soft/nontender/nondistended/normal bowel sounds. No rebound or guarding.  Ext: no edema Skin: warm, dry Neuro: grossly normal, moves all extremities, PERRLA   Assessment and Plan   63 y.o. female presenting for annual physical.  Health Maintenance counseling: 1. Anticipatory guidance: Patient counseled regarding regular dental exams -q6 months, eye exams - yearly,  avoiding smoking and second hand smoke , limiting alcohol to 1 beverage per day- 1 glass of wine per day , no illicit drugs .   2. Risk factor reduction:  Advised patient of need for regular exercise and diet rich and fruits and vegetables to reduce risk of heart attack and stroke.   Exercise- walking daily still but less yoga- would like to restart.  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  Wt Readings from Last 3 Encounters:  10/26/23 140 lb 6.4 oz (63.7 kg)  05/15/23 140 lb 9.6 oz (63.8 kg)  06/29/22 145 lb 3.2 oz (65.9 kg)  3. Immunizations/screenings/ancillary studies- shingrix holding off for now, wants to check MMR status as unsure from childhood. Opts out of COVID shot.  Immunization History  Administered Date(s) Administered   Moderna Covid Bivalent Peds Booster(67mo Thru 51yrs) 04/04/2020   Moderna Sars-Covid-2 Vaccination 08/19/2019, 09/09/2019   Td 08/27/2008   Tdap 08/27/2008, 12/21/2010, 01/06/2022  4. Cervical cancer screening- previously had seen Alys Julian  (retired and doesn't want to go back) with [facility] with last Pap on file 2021 with 3-year repeat planned but we looked today and actually human papilloma virus negative so can wait 1 more year- wants to do with female provider- will see if we can set her up in office.   5. Breast cancer screening-  breast exam with GYN in the past and mammogram -11/29/2022-definitely gets regularly with her mother's breast cancer history and 50s 6. Colon cancer screening - Tubular adenoma January 2024-repeat 7 years with Dr. Savannah Curlin 7. Skin cancer screening-due to melanoma history sees Dr. Theron Flavin and have encouraged yearly. advised regular sunscreen use. Denies worrisome, changing, or new skin lesions.  8. Birth control/STD check- postmenopausal monogamous 9. Osteoporosis screening at 15- osteopenia June 2021-doing calcium , vitamin D , weightbearing exercise-worst T-score is -2.3 lumbar spine- through GYN- she declines for now but may be willing next year- we will chat. Encouraged 1200mg  calcium  in diet. Still on 2000 units vitamin D  10. Smoking associated screening -never smoker  Status of  chronic or acute concerns   # Social update-mom was not doing well at age 22 at last visit with failing kidneys-today reports she lost her last month at age 73 to heart failure. Still enjoying retirement mostly -agility training for dog!   #hypertension S: medication: Amlodipine  10 mg-increased from 5 mg last visit BP Readings from Last 3 Encounters:  10/26/23 120/64  08/25/23 134/77  05/15/23 (!) 144/80  A/P: well controlled continue current medications    # Hyperglycemia/insulin resistance/prediabetes S:  Medication: None Lab Results  Component Value Date   HGBA1C 6.1 06/29/2022   HGBA1C 5.5 01/27/2020   HGBA1C 5.5 07/10/2015   A/P: A1c was trending up  last visit and wanted to wait until today to recheck  #Vitamin D  deficiency S: Medication: 00 units Last vitamin D  Lab Results  Component Value Date   VD25OH 36.29 06/29/2022   A/P: hopefully stable- update vitamin D   today. Continue current meds for now      #allergy season- better this year- sparing benadryl. Topical eye drops were helpful.  -occasional vertigo issues still- prior vestibular rehab worsened symptoms. Tolerable.   #gastroesophageal reflux disease better with dietary changes and being away from prior work   Recommended follow up: Return in about 1 year (around 10/25/2024) for physical or sooner if needed.Schedule b4 you leave.  Lab/Order associations: fasting   ICD-10-CM   1. Preventative health care  Z00.00     2. Osteopenia of lumbar spine  M85.88     3. Essential hypertension  I10     4. Hyperglycemia  R73.9 Hemoglobin A1c    5. Hyperlipidemia, unspecified hyperlipidemia type  E78.5 Comprehensive metabolic panel with GFR    CBC with Differential/Platelet    Lipid panel    6. Vitamin D  deficiency  E55.9 VITAMIN D  25 Hydroxy (Vit-D Deficiency, Fractures)    7. Measles, mumps, rubella (MMR) vaccination status unknown  Z78.9 Measles/Mumps/Rubella Immunity    8. Screening for diabetes mellitus   Z13.1 Hemoglobin A1c      No orders of the defined types were placed in this encounter.   Return precautions advised.  Clarisa Crooked, MD

## 2023-10-26 NOTE — Patient Instructions (Addendum)
 Let us  know if you get shingrix before next visit  Reaching out of the my colleagues to see about physical next year for pap   Likely bone density next year  Please stop by lab before you go If you have mychart- we will send your results within 3 business days of us  receiving them.  If you do not have mychart- we will call you about results within 5 business days of us  receiving them.  *please also note that you will see labs on mychart as soon as they post. I will later go in and write notes on them- will say "notes from Dr. Arlene Ben"    Recommended follow up: Return in about 1 year (around 10/25/2024) for physical or sooner if needed.Schedule b4 you leave. If you don't hear within 2 weeks about situation for next year please reach back out

## 2023-10-27 ENCOUNTER — Ambulatory Visit: Payer: Self-pay | Admitting: Family Medicine

## 2023-10-27 LAB — COMPREHENSIVE METABOLIC PANEL WITH GFR
ALT: 42 U/L — ABNORMAL HIGH (ref 0–35)
AST: 41 U/L — ABNORMAL HIGH (ref 0–37)
Albumin: 4.5 g/dL (ref 3.5–5.2)
Alkaline Phosphatase: 81 U/L (ref 39–117)
BUN: 14 mg/dL (ref 6–23)
CO2: 27 meq/L (ref 19–32)
Calcium: 9.4 mg/dL (ref 8.4–10.5)
Chloride: 101 meq/L (ref 96–112)
Creatinine, Ser: 0.56 mg/dL (ref 0.40–1.20)
GFR: 96.43 mL/min (ref >60.00)
Glucose, Bld: 99 mg/dL (ref 70–99)
Potassium: 3.8 meq/L (ref 3.5–5.1)
Sodium: 138 meq/L (ref 135–145)
Total Bilirubin: 0.5 mg/dL (ref 0.2–1.2)
Total Protein: 7.4 g/dL (ref 6.0–8.3)

## 2023-10-27 LAB — CBC WITH DIFFERENTIAL/PLATELET
Basophils Absolute: 0.1 10*3/uL (ref 0.0–0.1)
Basophils Relative: 1.2 % (ref 0.0–3.0)
Eosinophils Absolute: 0.1 10*3/uL (ref 0.0–0.7)
Eosinophils Relative: 1.6 % (ref 0.0–5.0)
HCT: 43.2 % (ref 36.0–46.0)
Hemoglobin: 14.7 g/dL (ref 12.0–15.0)
Lymphocytes Relative: 22.9 % (ref 12.0–46.0)
Lymphs Abs: 1.8 10*3/uL (ref 0.7–4.0)
MCHC: 34 g/dL (ref 30.0–36.0)
MCV: 99.7 fl (ref 78.0–100.0)
Monocytes Absolute: 0.5 10*3/uL (ref 0.1–1.0)
Monocytes Relative: 6.6 % (ref 3.0–12.0)
Neutro Abs: 5.3 10*3/uL (ref 1.4–7.7)
Neutrophils Relative %: 67.7 % (ref 43.0–77.0)
Platelets: 228 10*3/uL (ref 150.0–400.0)
RBC: 4.34 Mil/uL (ref 3.87–5.11)
RDW: 12.2 % (ref 11.5–15.5)
WBC: 7.8 10*3/uL (ref 4.0–10.5)

## 2023-10-27 LAB — LIPID PANEL
Cholesterol: 234 mg/dL — ABNORMAL HIGH (ref 0–200)
HDL: 85.2 mg/dL (ref >39.00)
LDL Cholesterol: 124 mg/dL — ABNORMAL HIGH (ref 0–99)
NonHDL: 148.69
Total CHOL/HDL Ratio: 3
Triglycerides: 122 mg/dL (ref 0.0–149.0)
VLDL: 24.4 mg/dL (ref 0.0–40.0)

## 2023-10-27 LAB — HEMOGLOBIN A1C: Hgb A1c MFr Bld: 6 % (ref 4.6–6.5)

## 2023-10-27 LAB — VITAMIN D 25 HYDROXY (VIT D DEFICIENCY, FRACTURES): VITD: 35.89 ng/mL (ref 30.00–100.00)

## 2023-10-28 LAB — MEASLES/MUMPS/RUBELLA IMMUNITY
Mumps IgG: <9 [AU]/ml — ABNORMAL LOW
Rubella: 27.4 {index}
Rubeola IgG: <13.5 [AU]/ml — ABNORMAL LOW

## 2023-11-01 ENCOUNTER — Telehealth: Payer: Self-pay | Admitting: Family Medicine

## 2023-11-01 NOTE — Telephone Encounter (Signed)
 I appreciate you both very much!  Thank you both for helping her

## 2023-11-01 NOTE — Telephone Encounter (Signed)
 Noted and agreed, thank you.

## 2023-11-01 NOTE — Telephone Encounter (Signed)
-----   Message from Clarisa Crooked sent at 10/31/2023  8:56 PM EDT ----- Havlyn!   Alyssa has agreed to see this patient for a physical in 1 year as she needs a pap and prefers with female provider- would you call and try to set this up with her or mychart her? Id most greatly appreciate it!!!  Thanks,  Clarisa Crooked

## 2023-11-01 NOTE — Telephone Encounter (Signed)
 Called pt and scheduled CPE for 10/29/24 @ 8:30 am w/ Alyssa Allwardt.

## 2023-11-14 ENCOUNTER — Ambulatory Visit

## 2023-11-14 DIAGNOSIS — Z23 Encounter for immunization: Secondary | ICD-10-CM | POA: Diagnosis not present

## 2023-11-20 ENCOUNTER — Other Ambulatory Visit: Payer: Self-pay | Admitting: Family Medicine

## 2023-11-20 DIAGNOSIS — Z1231 Encounter for screening mammogram for malignant neoplasm of breast: Secondary | ICD-10-CM

## 2023-11-23 ENCOUNTER — Other Ambulatory Visit: Payer: Self-pay | Admitting: Family Medicine

## 2023-11-30 ENCOUNTER — Ambulatory Visit
Admission: RE | Admit: 2023-11-30 | Discharge: 2023-11-30 | Disposition: A | Discharge status: Home / Self Care | Source: Ambulatory Visit | Attending: Family Medicine | Admitting: Family Medicine

## 2023-11-30 DIAGNOSIS — Z1231 Encounter for screening mammogram for malignant neoplasm of breast: Secondary | ICD-10-CM

## 2023-12-25 IMAGING — MG MM DIGITAL SCREENING BILAT W/ TOMO AND CAD
8 series · 8 of 24 positions shown · non-contrast
Comparison: Previous exam(s).

CLINICAL DATA: Screening.

EXAM:
DIGITAL SCREENING BILATERAL MAMMOGRAM WITH TOMOSYNTHESIS AND CAD
TECHNIQUE: Bilateral screening digital craniocaudal and mediolateral oblique
mammograms were obtained. Bilateral screening digital breast
tomosynthesis was performed. The images were evaluated with
computer-aided detection.

[R CC synth-2D]
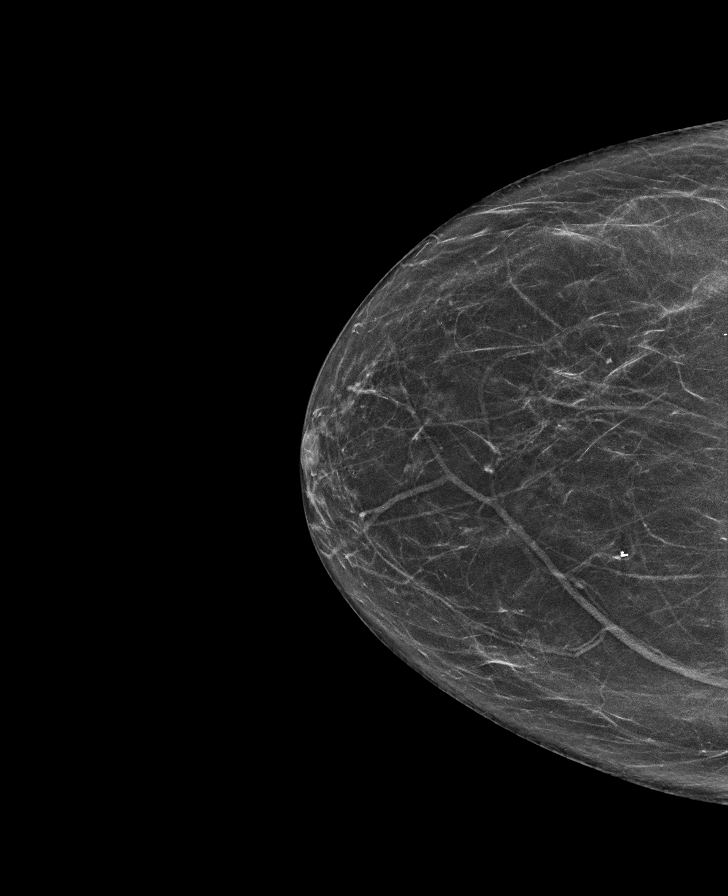

[L MLO synth-2D]
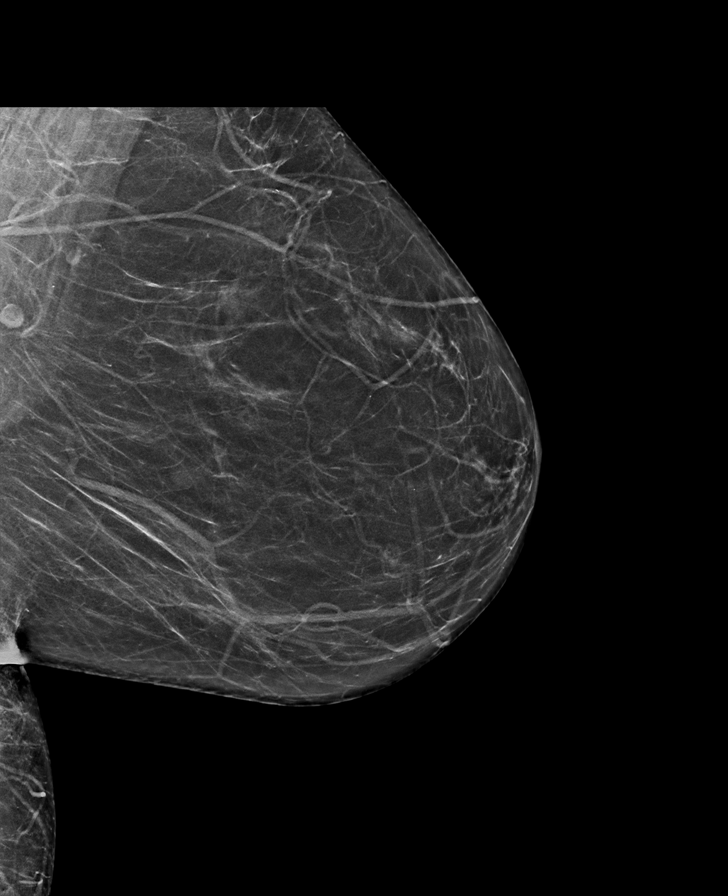

[R MLO synth-2D]
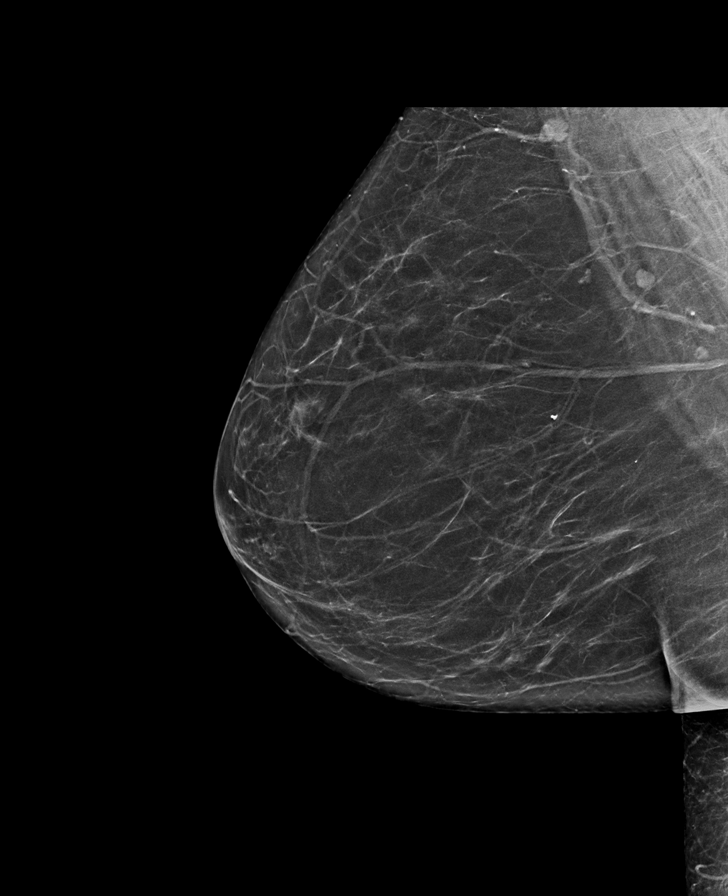

[L CC synth-2D]
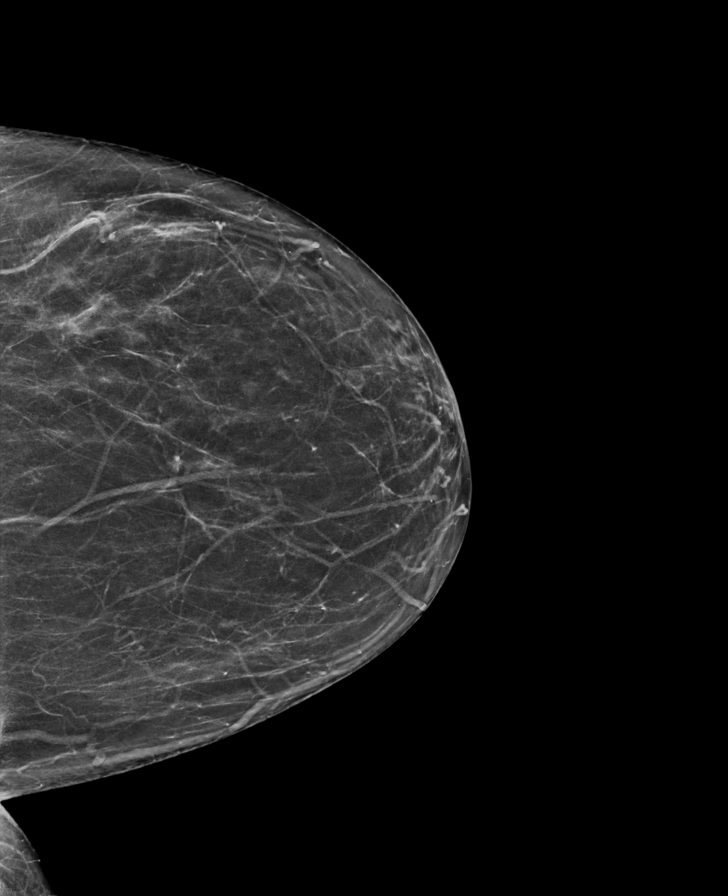

[L CC tomo · tomo slice 31/60.0]
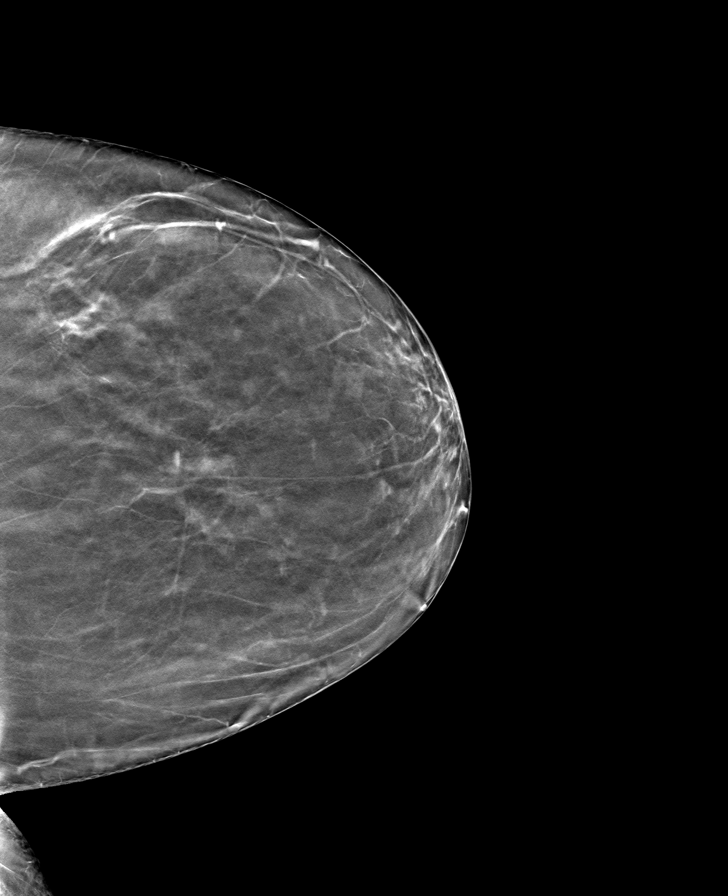

[R MLO tomo · tomo slice 33/64.0]
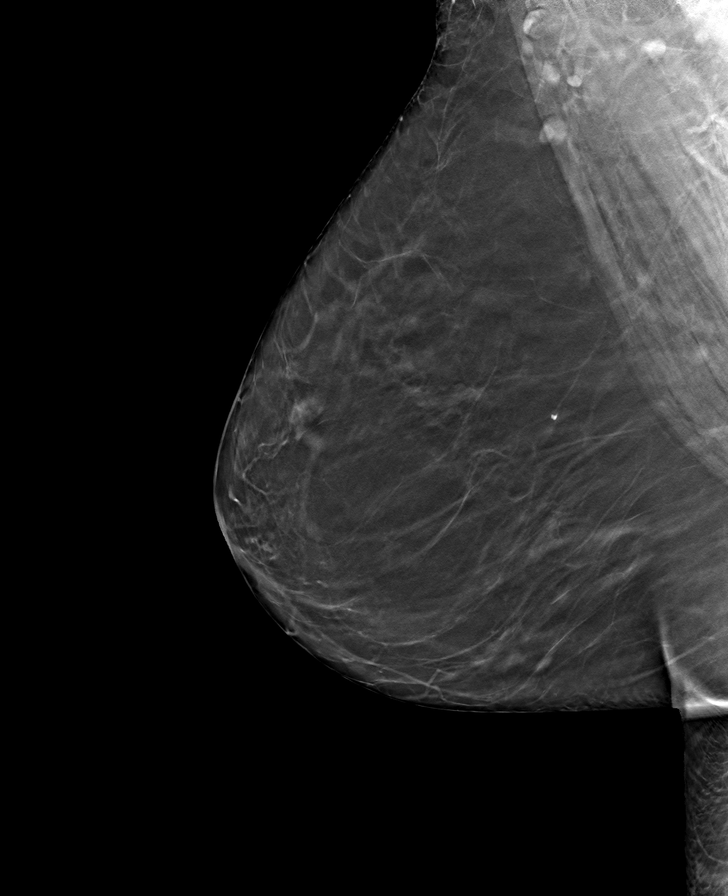

[L MLO tomo · tomo slice 33/65.0]
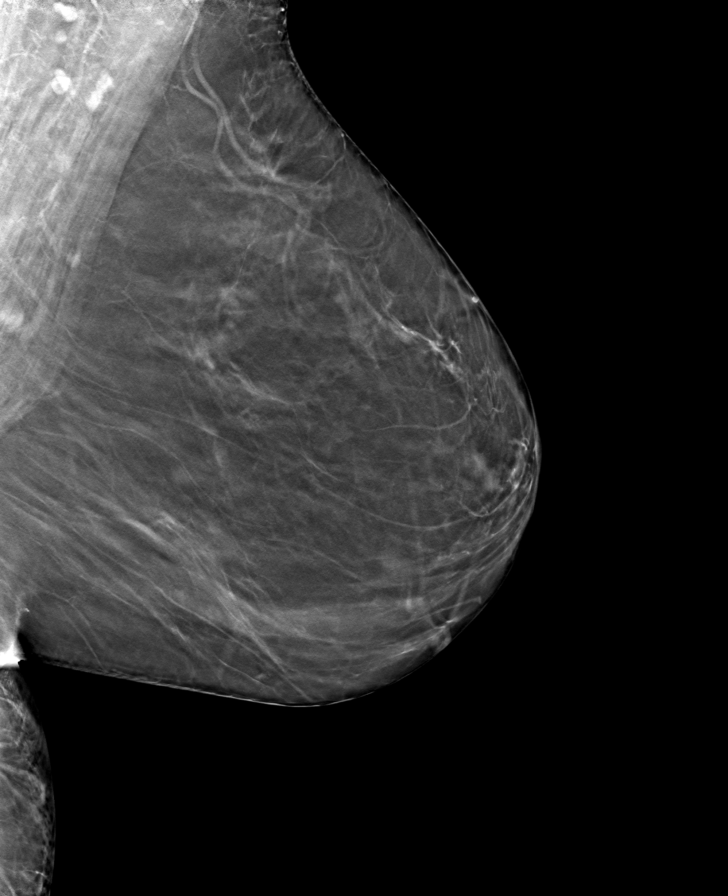

[R CC tomo · tomo slice 30/59.0]
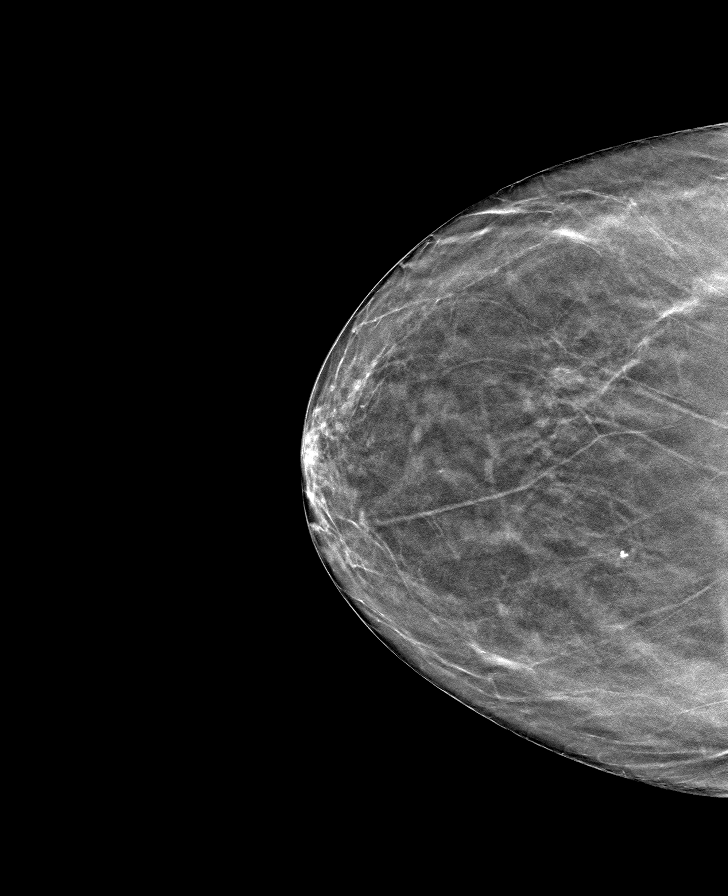

[8 of 24 positions shown; findings below may reference images not displayed]

ACR Breast Density Category b: There are scattered areas of
fibroglandular density.
FINDINGS: There are no findings suspicious for malignancy.
IMPRESSION: No mammographic evidence of malignancy. A result letter of this
screening mammogram will be mailed directly to the patient.

RECOMMENDATION:
Screening mammogram in one year. (Code:51-O-LD2)

BI-RADS CATEGORY  1: Negative.

## 2024-05-30 ENCOUNTER — Other Ambulatory Visit: Payer: Self-pay | Admitting: Family Medicine

## 2024-10-29 ENCOUNTER — Encounter: Admitting: Physician Assistant
